# Patient Record
Sex: Male | Born: 1955 | Race: White | Hispanic: No | Marital: Single | Smoking: Former smoker
Health system: Southern US, Community
[De-identification: ages and names within clinical notes are randomized; demographics above are authoritative.]

## PROBLEM LIST (undated history)

## (undated) DIAGNOSIS — J449 Chronic obstructive pulmonary disease, unspecified: Secondary | ICD-10-CM

## (undated) DIAGNOSIS — I10 Essential (primary) hypertension: Secondary | ICD-10-CM

## (undated) DIAGNOSIS — E785 Hyperlipidemia, unspecified: Secondary | ICD-10-CM

## (undated) DIAGNOSIS — J439 Emphysema, unspecified: Secondary | ICD-10-CM

## (undated) DIAGNOSIS — I700 Atherosclerosis of aorta: Secondary | ICD-10-CM

## (undated) HISTORY — DX: Chronic obstructive pulmonary disease, unspecified: J44.9

## (undated) HISTORY — DX: Emphysema, unspecified: J43.9

## (undated) HISTORY — DX: Hyperlipidemia, unspecified: E78.5

## (undated) HISTORY — PX: WISDOM TOOTH EXTRACTION: SHX21

## (undated) HISTORY — PX: CARDIAC CATHETERIZATION: SHX172

## (undated) HISTORY — DX: Atherosclerosis of aorta: I70.0

## (undated) HISTORY — DX: Essential (primary) hypertension: I10

## (undated) HISTORY — PX: TONSILLECTOMY: SHX5217

---

## 2003-03-22 ENCOUNTER — Encounter: Admission: RE | Admit: 2003-03-22 | Discharge: 2003-04-12 | Payer: Self-pay | Admitting: Sports Medicine

## 2009-02-08 ENCOUNTER — Ambulatory Visit: Payer: Self-pay | Admitting: Internal Medicine

## 2009-02-08 DIAGNOSIS — I10 Essential (primary) hypertension: Secondary | ICD-10-CM | POA: Insufficient documentation

## 2009-02-08 DIAGNOSIS — J449 Chronic obstructive pulmonary disease, unspecified: Secondary | ICD-10-CM | POA: Insufficient documentation

## 2009-02-08 DIAGNOSIS — Z860100 Personal history of colon polyps, unspecified: Secondary | ICD-10-CM | POA: Insufficient documentation

## 2009-02-08 DIAGNOSIS — M810 Age-related osteoporosis without current pathological fracture: Secondary | ICD-10-CM | POA: Insufficient documentation

## 2009-04-05 ENCOUNTER — Ambulatory Visit: Payer: Self-pay | Admitting: Internal Medicine

## 2009-04-05 DIAGNOSIS — E785 Hyperlipidemia, unspecified: Secondary | ICD-10-CM | POA: Insufficient documentation

## 2009-04-05 LAB — CONVERTED CEMR LAB
ALT: 25 U/L
AST: 19 U/L
Albumin: 4.5 g/dL
Alkaline Phosphatase: 95 U/L
BUN: 19 mg/dL
Basophils Absolute: 0.1 K/uL
Basophils Relative: 1 %
Bilirubin, Direct: 0.1 mg/dL
CO2: 26 meq/L
Calcium: 9.8 mg/dL
Chloride: 102 meq/L
Cholesterol: 198 mg/dL
Creatinine, Ser: 1.03 mg/dL
Eosinophils Absolute: 0.4 K/uL
Eosinophils Relative: 5 %
Glucose, Bld: 89 mg/dL
HCT: 41.8 %
HDL: 37 mg/dL — ABNORMAL LOW
Hemoglobin: 13.9 g/dL
Indirect Bilirubin: 0.4 mg/dL
LDL Cholesterol: 127 mg/dL — ABNORMAL HIGH
Lymphocytes Relative: 27 %
Lymphs Abs: 2.4 K/uL
MCHC: 33.3 g/dL
MCV: 89.9 fL
Monocytes Absolute: 0.8 K/uL
Monocytes Relative: 9 %
Neutro Abs: 5.2 K/uL
Neutrophils Relative %: 59 %
Platelets: 244 K/uL
Potassium: 4.5 meq/L
RBC: 4.65 M/uL
RDW: 13.2 %
Sodium: 138 meq/L
TSH: 0.681 u[IU]/mL
Testosterone: 225.31 ng/dL — ABNORMAL LOW
Total Bilirubin: 0.5 mg/dL
Total CHOL/HDL Ratio: 5.4
Total Protein: 6.9 g/dL
Triglycerides: 169 mg/dL — ABNORMAL HIGH
VLDL: 34 mg/dL
Vit D, 1,25-Dihydroxy: 34
WBC: 8.9 10*3/microliter

## 2009-04-08 ENCOUNTER — Telehealth (INDEPENDENT_AMBULATORY_CARE_PROVIDER_SITE_OTHER): Payer: Self-pay | Admitting: *Deleted

## 2009-04-12 ENCOUNTER — Ambulatory Visit: Payer: Self-pay | Admitting: Internal Medicine

## 2009-07-01 ENCOUNTER — Telehealth: Payer: Self-pay | Admitting: Internal Medicine

## 2009-07-01 DIAGNOSIS — N529 Male erectile dysfunction, unspecified: Secondary | ICD-10-CM | POA: Insufficient documentation

## 2009-07-12 ENCOUNTER — Encounter: Payer: Self-pay | Admitting: Internal Medicine

## 2009-07-12 LAB — CONVERTED CEMR LAB
BUN: 16 mg/dL (ref 6–23)
CO2: 25 meq/L (ref 19–32)
Chloride: 104 meq/L (ref 96–112)
Glucose, Bld: 76 mg/dL (ref 70–99)
Potassium: 4.6 meq/L (ref 3.5–5.3)

## 2009-07-15 ENCOUNTER — Encounter: Payer: Self-pay | Admitting: Internal Medicine

## 2009-07-19 ENCOUNTER — Ambulatory Visit: Payer: Self-pay | Admitting: Internal Medicine

## 2009-07-19 DIAGNOSIS — E291 Testicular hypofunction: Secondary | ICD-10-CM | POA: Insufficient documentation

## 2009-10-11 ENCOUNTER — Ambulatory Visit: Payer: Self-pay | Admitting: Internal Medicine

## 2009-11-25 ENCOUNTER — Telehealth: Payer: Self-pay | Admitting: Internal Medicine

## 2010-02-27 NOTE — Assessment & Plan Note (Signed)
Summary: 2 mo. f/u - jr   Vital Signs:  Patient profile:   55 year old male Weight:      204 pounds BMI:     30.23 O2 Sat:      96 % on Room air Temp:     97.9 degrees F oral Pulse rate:   78 / minute Pulse rhythm:   regular Resp:     16 per minute BP sitting:   90 / 60  (right arm) Cuff size:   large  Vitals Entered By: Glendell Docker CMA (April 12, 2009 2:58 PM)  O2 Flow:  Room air CC: Rm 3- 2 Month Follow up    Primary Care Provider:  Dondra Spry DO  CC:  Rm 3- 2 Month Follow up .  History of Present Illness:  Hypertension Follow-Up      This is a 55 year old man who presents for Hypertension follow-up.  The patient denies lightheadedness and urinary frequency.  The patient denies the following associated symptoms: chest pain.  Compliance with medications (by patient report) has been near 100%.    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   Lungs:  normal respiratory effort, normal breath sounds, and no wheezes.   Heart:  normal rate, regular rhythm, and no gallop.   Extremities:  No lower extremity edema    Impression & Recommendations:  Problem # 1:  HYPERTENSION (ICD-401.9) Lower BP med dose to  10/12.5 mg.  monitor bp at home.  His updated medication list for this problem includes:    Lisinopril-hydrochlorothiazide 10-12.5 Mg Tabs (Lisinopril-hydrochlorothiazide) ..... One by mouth once daily  BP today: 90/60 Prior BP: 104/78 (02/08/2009)  Labs Reviewed: K+: 4.5 (04/05/2009) Creat: : 1.03 (04/05/2009)   Chol: 198 (04/05/2009)   HDL: 37 (04/05/2009)   LDL: 127 (04/05/2009)   TG: 169 (04/05/2009)  Problem # 2:  COPD (ICD-496) No wheezing.   mild DOE.  DC advair.  The following medications were removed from the medication list:    Advair Diskus 250-50 Mcg/dose Aepb (Fluticasone-salmeterol) .Marland Kitchen... 1 puff by mouth two times a day as needed  Complete Medication List: 1)  Lisinopril-hydrochlorothiazide 10-12.5 Mg Tabs (Lisinopril-hydrochlorothiazide) .... One by mouth once daily 2)  Alendronate Sodium 70 Mg Tabs (Alendronate sodium) .... One tablet by mouth once weekly  Patient Instructions: 1)  Please schedule a follow-up appointment in 6 months. Prescriptions: LISINOPRIL-HYDROCHLOROTHIAZIDE 10-12.5 MG TABS (LISINOPRIL-HYDROCHLOROTHIAZIDE) one by mouth once daily  #30 x 5   Entered and Authorized by:   D. Thomos Lemons DO   Signed by:   D. Thomos Lemons DO on 04/12/2009   Method used:   Electronically to        [facility] # [phone]* (retail)  [address]       [place], [place]  [address]       Ph: [phone]       Fax: [phone]   RxID:   [id number]   Current Allergies (reviewed today): No known allergies

## 2010-02-27 NOTE — Assessment & Plan Note (Signed)
Summary: new to est rsc from bump/mhf   Vital Signs:  Patient profile:   55 year old male Height:      69 inches Weight:      213.25 pounds BMI:     31.61 O2 Sat:      97 % on Room air Temp:     97.7 degrees F oral Pulse rate:   92 / minute Pulse rhythm:   regular Resp:     22 per minute BP sitting:   104 / 78  (right arm) Cuff size:   large  Vitals Entered By: Glendell Docker CMA (February 08, 2009 1:14 PM)  O2 Flow:  Room air  Primary Care Provider:  D. Thomos Lemons DO  CC:  New Patient .  History of Present Illness: New Patient  55 y/o with hx of htn, COPD, and osteoporosis to establish  He was diagnosed with htn 5 yrs ago.  BP is well controlled on lisinopril /HCTZ.  Patient reports good medication compliance. He denies chronic cough. He denies dizziness. No history of stroke or coronary artery disease  Patient previously diagnosed with osteoporosis. Unclear if secondary workup performed. He reports low libido and erectile dysfunction greater than 5 years. He takes generic Fosamax 70 mg once weekly. He is also taking vitamin D supplement. He denies esophageal or gastric irritation. No history of dysphagia.  Patient was diagnosed with COPD in 2009. He has history of tobacco use.  60 pack year history. He denies chronic cough. Some dyspnea and exertion. Dyspnea prompted stress test in 2009-reported normal. He has a prescription for Advair but rarely uses. He tries to walk 1 mile a day. He has intermittent dyspnea.  He reports sugars history of heavy alcohol use but quit drinking greater than 10 years ago. No report of chronic liver disease.  Preventive Screening-Counseling & Management  Alcohol-Tobacco     Alcohol drinks/day: 0     Smoking Status: quit     Packs/Day: 1.5     Year Started: 1972     Year Quit: 2004  Caffeine-Diet-Exercise     Caffeine use/day: 1 beverage daily     Does Patient Exercise: yes     Type of exercise: walking     Times/week: 3  Allergies  (verified): No Known Drug Allergies  Past History:  Past Medical History: Hypertension COPD Cardiac stress test negative - 2009 (performed due to dyspnea) Osteoporosis Colonic polyps, hx of (last colonoscopy June/2008-5 polyps removed) [facility]ologist  Past Surgical History: Tonsillectomy  Family History: Family History Hypertension Family History of Prostate CA 1st degree relative <50  Social History: 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  The patient denies chest pain, peripheral edema, prolonged cough, abdominal pain, melena, hematochezia, and severe indigestion/heartburn.         screening colonoscopy June/2008-5 polyps removed. Colonoscopy performed by gastroenterologist in [facility].  no symptoms of claudication,  history of chronic back pain, hip pain or knee pain.  Physical Exam  General:  alert and overweight-appearing.   Head:  normocephalic and atraumatic.   Eyes:  pupils equal, pupils round, and pupils reactive to light.   Ears:  R ear normal and L ear normal.  Mouth:  teeth missing.  upper dental plate Neck:  No deformities, masses, or tenderness noted.no carotid bruits.   Lungs:  normal respiratory effort, normal breath sounds, no crackles, and no wheezes.   Heart:  normal rate, regular rhythm, and no gallop.  heart sounds somewhat distant Abdomen:  protuberant,soft, non-tender, no masses, no hepatomegaly, and no splenomegaly.   Pulses:  diminished pedis dorsalis pulse bilaterally palpable posterior tibial pulse bilaterally Extremities:  No lower extremity edema  Neurologic:  cranial nerves II-XII intact and gait normal.   Psych:  normally interactive  and good eye contact.     Impression & Recommendations:  Problem # 1:  HYPERTENSION (ICD-401.9) BP is well controlled.  BMET and FLP before next OV. obtain copy of medical records.  His updated medication list for this problem includes:    Lisinopril-hydrochlorothiazide 20-25 Mg Tabs (Lisinopril-hydrochlorothiazide) .Marland Kitchen... Take 1 tablet by mouth once a day  BP today: 104/78  Problem # 2:  COPD (ICD-496) Assessment: Unchanged Last PFT 1 yr ago.  some dyspnea with exertion.  obesity maybe contributing factor. We discussed weight loss strategies. Obtain copy of previous PFT.   patient up-to-date with her influenza vaccine. Update with Pneumovax today. His updated medication list for this problem includes:    Advair Diskus 250-50 Mcg/dose Aepb (Fluticasone-salmeterol) .Marland Kitchen... 1 puff by mouth two times a day as needed  Problem # 3:  OSTEOPOROSIS (ICD-733.00) check testosterone level and vit D level.  obtain copy of prev dexa.  no gi complications from fosamax.  His updated medication list for this problem includes:    Alendronate Sodium 70 Mg Tabs (Alendronate sodium) ..... One tablet by mouth once weekly  Complete Medication List: 1)  Lisinopril-hydrochlorothiazide 20-25 Mg Tabs (Lisinopril-hydrochlorothiazide) .... Take 1 tablet by mouth once a day 2)  Advair Diskus 250-50 Mcg/dose Aepb (Fluticasone-salmeterol) .Marland Kitchen.. 1 puff by mouth two times a day as needed 3)  Alendronate Sodium 70 Mg Tabs (Alendronate sodium) .... One tablet by mouth once weekly  Other Orders: Pneumococcal Vaccine (04540) Admin 1st Vaccine (98119)  Patient Instructions: 1)  Please schedule a follow-up appointment in 2 months. 2)  BMP prior to visit, ICD-9:  401.9 3)  Hepatic Panel prior to visit, ICD-9: 272.4 4)  Lipid Panel prior to visit, ICD-9: 272.4 5)  TSH prior to visit, ICD-9: 272.4 6)  CBC w/ Diff prior to visit, ICD-9: 496 7)  Total testosterone: 733.00 8)  Vitamin D level:  733.00 9)  Please return  for lab work one (1) week before your next appointment.  Prescriptions: ALENDRONATE SODIUM 70 MG TABS (ALENDRONATE SODIUM) one tablet by mouth once weekly  #4 x 3   Entered and Authorized by:   D. Thomos Lemons DO   Signed by:   D. Thomos Lemons DO on 02/08/2009   Method used:   Electronically to        [facility] # [phone]* (retail)       [address]       [place], [place]  [address]       Ph: [phone]       Fax: [phone]   RxID:   [id number] LISINOPRIL-HYDROCHLOROTHIAZIDE 20-25 MG TABS (LISINOPRIL-HYDROCHLOROTHIAZIDE) Take 1 tablet by mouth once a day  #30 x 3   Entered and Authorized by:   D. Thomos Lemons DO   Signed by:   D. Thomos Lemons DO on 02/08/2009   Method used:  Electronically to        [facility] # [phone]* (retail)       [address]       [place], [place]  [address]       Ph: [phone]       Fax: [phone]   RxID:   [id number]   Current Allergies (reviewed today): No known allergies      Immunization History:  Tetanus/Td Immunization History:    Tetanus/Td:  historical (07/04/2008)  Influenza Immunization History:    Influenza:  historical (12/11/2008)  Immunizations Administered:  Pneumonia Vaccine:    Vaccine Type: Pneumovax    Site: left deltoid    Mfr: Merck    Dose: 0.5 ml    Route: IM    Given by: Glendell Docker CMA    Exp. Date: 01/10/2010    Lot #: [id number]    VIS given: 11/01/2007    Preventive Care Screening  Last Tetanus Booster:    Date:  07/04/2008    Results:  Historical   Colonoscopy:    Date:  07/21/2006    Results:  Done

## 2010-02-27 NOTE — Assessment & Plan Note (Signed)
Summary: FOLLOW UP ON LABS PER PHONE MESSAGE FROM DARLENE/[facility]   Vital Signs:  Patient profile:   55 year old male Weight:      205.75 pounds BMI:     30.49 O2 Sat:      95 % on Room air Temp:     97.6 degrees F oral Pulse rate:   73 / minute Pulse rhythm:   regular Resp:     20 per minute BP sitting:   108 / 68  (right arm) Cuff size:   regular  Vitals Entered By: Glendell Docker CMA (July 19, 2009 2:11 PM)  O2 Flow:  Room air CC: Rm 2- Follow up on labs Is Patient Diabetic? No Comments questions about testosterone   Primary Care Provider:  DThomos Lemons DO  CC:  Rm 2- Follow up on labs.  History of Present Illness:  Hypertension Follow-Up      This is a 55 year old man who presents for Hypertension follow-up.  The patient reports lightheadedness and impotence.  The patient denies the following associated symptoms: chest pain.  Compliance with medications (by patient report) has been near 100%.  The patient reports that dietary compliance has been fair.    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   Neck:  No deformities, masses, or tenderness noted.no carotid bruits.   Lungs:  normal respiratory effort, normal breath sounds, and no wheezes.   Heart:  normal rate, regular rhythm, and no gallop.   Extremities:  No lower extremity edema  Psych:  normally interactive, good eye contact, not anxious appearing, and not depressed appearing.     Impression & Recommendations:  Problem #  1:  HYPERTENSION (ICD-401.9)  His updated medication list for this problem includes:    Lisinopril-hydrochlorothiazide 10-12.5 Mg Tabs (Lisinopril-hydrochlorothiazide) ..... One half tab  by mouth once daily  BP today: 108/68 Prior BP: 90/60 (04/12/2009)  Labs Reviewed: K+: 4.6 (07/12/2009) Creat: : 0.94 (07/12/2009)   Chol: 198 (04/05/2009)   HDL: 37 (04/05/2009)   LDL: 127 (04/05/2009)   TG: 169 (04/05/2009)  Problem # 2:  ERECTILE DYSFUNCTION, ORGANIC (ICD-607.84) Assessment: Improved  His updated medication list for this problem includes:    Viagra 100 Mg Tabs (Sildenafil citrate) .Marland Kitchen... 1/2 to 1 tab once daily as directed  Problem # 3:  HYPOGONADISM (ICD-257.2) we discussed pros and cons of testosterone replacement I would favor avoiding testosterone replacement due to assoc cv risk  Complete Medication List: 1)  Lisinopril-hydrochlorothiazide 10-12.5 Mg Tabs (Lisinopril-hydrochlorothiazide) .... One half tab  by mouth once daily 2)  Alendronate Sodium 70 Mg Tabs (Alendronate sodium) .... One tablet by mouth once weekly 3)  Viagra 100 Mg Tabs (Sildenafil citrate) .... 1/2 to 1 tab once daily as directed  Patient Instructions: 1)  Please schedule a follow-up appointment in 3 months. 2)  serum free testosterone - 257.2 3)  FSH, LH - 257.2 4)  Please return for lab work one (1) week before your next appointment.  5)  The highlighted prescriptions were electronically sent to your pharmacy Prescriptions: ALENDRONATE  SODIUM 70 MG TABS (ALENDRONATE SODIUM) one tablet by mouth once weekly  #4 x 5   Entered and Authorized by:   D. Thomos Lemons DO   Signed by:   D. Thomos Lemons DO on 07/19/2009   Method used:   Electronically to        [facility] # [phone]* (retail)       [address]       [place], [place]  [address]       Ph: [phone]       Fax: [phone]   RxID:   [id number] LISINOPRIL-HYDROCHLOROTHIAZIDE 10-12.5 MG TABS  (LISINOPRIL-HYDROCHLOROTHIAZIDE) one half tab  by mouth once daily  #30 x 5   Entered and Authorized by:   D. Thomos Lemons DO   Signed by:   D. Thomos Lemons DO on 07/19/2009   Method used:   Electronically to        [facility] # [phone]* (retail)       [address]       [place], [place]  [address]       Ph: [phone]       Fax: [phone]   RxID:   [id number] VIAGRA 100 MG TABS (SILDENAFIL CITRATE) 1/2 to 1 tab once daily as directed  #5 x 0   Entered and Authorized by:   D. Thomos Lemons DO   Signed by:   D. Thomos Lemons DO on 07/19/2009   Method used:   Electronically to        [facility] # [phone]* (retail)       [address]       [place], [place]  [address]       Ph: [phone]       Fax: [phone]   RxID:   [id number]   Current Allergies (reviewed today): No known allergies

## 2010-02-27 NOTE — Letter (Signed)
   [place] at [facility] [address] [place], [place]  [address]  [place] Phone: [phone]      July 15, 2009   [facility] [address] [place], [place] [address]  RE:  LAB RESULTS  Dear  Jacob Frost,  The following is an interpretation of your most recent lab tests.  Please take note of any instructions provided or changes to medications that have resulted from your lab work.  ELECTROLYTES:  Good - no changes needed  KIDNEY FUNCTION TESTS:  Good - no changes needed         Sincerely Yours,    Dr. Thomos Lemons

## 2010-02-27 NOTE — Progress Notes (Signed)
Summary: refill request   Phone Note Refill Request Message from:  Fax from Pharmacy on April 08, 2009 8:41 AM  Refills Requested: Medication #1:  LISINOPRIL-HYDROCHLOROTHIAZIDE 20-25 MG TABS Take 1 tablet by mouth once a day   Dosage confirmed as above?Dosage Confirmed   Brand Name Necessary? No   Supply Requested: 3 months   Last Refilled: 02/08/2009 [facility] [address] [place] [address] fax [phone] phone [phone]   Method Requested: Electronic Next Appointment Scheduled: 04-12-2009 dr Artist Pais  Initial call taken by: Roselle Locus,  April 08, 2009 8:43 AM  Follow-up for Phone Call        Rx refilled. Spoke to [place] at [place] and corrected quantity to #90 x no refills. Follow-up by: Mervin Kung CMA,  April 08, 2009 9:47 AM    Prescriptions: LISINOPRIL-HYDROCHLOROTHIAZIDE 20-25 MG TABS (LISINOPRIL-HYDROCHLOROTHIAZIDE) Take 1 tablet by mouth once a day  #30 x 3   Entered by:   Mervin Kung CMA   Authorized by:   D. Thomos Lemons DO   Signed by:   Mervin Kung CMA on 04/08/2009   Method used:   Electronically to        [facility] # [phone]* (retail)       [address]       [place], [place]  [address]       Ph: [phone]       Fax: [phone]   RxID:   [id number]

## 2010-02-27 NOTE — Progress Notes (Signed)
Summary: Blood  Pressure Readings  Phone Note Call from Patient Call back at Home Phone [phone]   Caller: Patient Call For: D. Thomos Lemons DO Summary of Call: patient called and left voice message  requesting a  return call to report his blood pressure reading since his last office visit Initial call taken by: Glendell Docker CMA,  July 01, 2009 2:51 PM  Follow-up for Phone Call        call was returned to patient he states his blood pressure readings were as followed, and he aslo states that Dr Artist Pais asked him about addressing his testosterone level and at the time he did not want to,however he would like to have that addressed now 88/64 96/62 112/63 118/65 102/65 112/73 97/60 98/69  103/64 Follow-up by: Glendell Docker CMA,  July 01, 2009 2:57 PM  Additional Follow-up for Phone Call Additional follow up Details #1::        please instruct pt to take 1/2 of bp med dose. Instruct pt to come in for BMET in 1 week tired to call pt - no answer at [phone] number I suggest OV within 2 weeks Additional Follow-up by: D. Thomos Lemons DO,  July 01, 2009 5:23 PM  New Problems: ERECTILE DYSFUNCTION, ORGANIC [phone])   Additional Follow-up for Phone Call Additional follow up Details #2::    attempted to contact patient at [phone], no answer, detailed voice message left informing patient per Dr Artist Pais instructions, he was advised to call back to schedule 2 week follow with Dr Artist Pais and return for lab work in one week. Follow-up by: Glendell Docker CMA,  July 02, 2009 8:05 AM  New Problems: ERECTILE DYSFUNCTION, ORGANIC (ICD-607.84) New/Updated Medications: LISINOPRIL-HYDROCHLOROTHIAZIDE 10-12.5 MG TABS (LISINOPRIL-HYDROCHLOROTHIAZIDE) one half tab  by mouth once daily

## 2010-02-27 NOTE — Assessment & Plan Note (Signed)
Summary: 6 month follow up/mhf   Vital Signs:  Patient profile:   55 year old male Height:      69 inches Weight:      205 pounds BMI:     30.38 Temp:     98.0 degrees F oral Pulse rate:   76 / minute Pulse rhythm:   regular Resp:     16 per minute BP sitting:   92 / 76  (left arm) Cuff size:   regular  Vitals Entered By: Lanier Prude, CMA(AAMA) (October 11, 2009 11:18 AM) CC: 3 mo f/u Is Patient Diabetic? No   Primary Care Provider:  Dondra Spry DO  CC:  3 mo f/u.  History of Present Illness:  Hypertension Follow-Up      This is a 55 year old man who presents for Hypertension follow-up.  The patient denies lightheadedness and headaches.  The patient denies the following associated symptoms: chest pain.     Current Diet Breakfast: bacon and egg,  sausage biscuits Lunch: peanut butter sandwich, potatoe chips DInner:  chicken /fried,  TV dinner,  fast food or buffet Beverage:  soft drinks, water  stop drinking - 5 yrs ago.      Preventive Screening-Counseling & Management  Alcohol-Tobacco     Alcohol drinks/day: 0     Smoking Status: quit     Packs/Day: 1.5     Year Started: 1972     Year Quit: 2004  Current Medications (verified): 1)  Lisinopril-Hydrochlorothiazide 10-12.5 Mg Tabs (Lisinopril-Hydrochlorothiazide) .... One Half Tab  By Mouth Once Daily 2)  Alendronate Sodium 70 Mg Tabs (Alendronate Sodium) .... One Tablet By Mouth Once Weekly 3)  Viagra 100 Mg Tabs (Sildenafil Citrate) .... 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   Lungs:  normal respiratory effort, normal breath sounds, and no wheezes.   Heart:  normal rate, regular rhythm, and no gallop.   Extremities:  No lower extremity edema    Impression & Recommendations:  Problem # 1:  HYPERTENSION (ICD-401.9) BP too low.   DC antihypertensive.  monitor BP at home.  The following medications were removed from the medication list:    Lisinopril-hydrochlorothiazide 10-12.5 Mg Tabs (Lisinopril-hydrochlorothiazide) ..... One half tab  by mouth once daily  BP today: 92/76 Prior BP: 108/68 (07/19/2009)  Labs Reviewed: K+: 4.6 (07/12/2009) Creat: : 0.94 (07/12/2009)   Chol: 198 (04/05/2009)   HDL: 37 (04/05/2009)   LDL: 127 (04/05/2009)   TG: 169 (04/05/2009)  Complete Medication List: 1)  Alendronate Sodium 70 Mg Tabs (Alendronate sodium) .... One tablet by mouth once weekly 2)  Viagra 100 Mg Tabs (Sildenafil citrate) .... 1/2 to 1 tab once daily as directed  Patient Instructions: 1)  Please schedule a follow-up appointment in 3 months. 2)  Monitor BP at home.  Call our office if there is significant change

## 2010-02-27 NOTE — Progress Notes (Signed)
Summary: bp readings   Phone Note Call from Patient Call back at Home Phone [phone]   Caller: Patient Call For: yoo  Summary of Call: Has been taking his blood pressure readings.  Dr Artist Pais said to call if they got over 130.  They have been running above that for a few days.   Initial call taken by: Roselle Locus,  November 25, 2009 4:38 PM  Follow-up for Phone Call        restart just lisinopril. see rx FOV in 2 months call office if BP gets worse Follow-up by: D. Thomos Lemons DO,  November 26, 2009 11:56 AM  Additional Follow-up for Phone Call Additional follow up Details #1::        call placed to patient at [phone], he has been advised per Dr Artist Pais instructions. Patient has appointment on file for 01/10/2010 @ 10:45PM Additional Follow-up by: Glendell Docker CMA,  November 26, 2009 2:44 PM    New/Updated Medications: LISINOPRIL 5 MG TABS (LISINOPRIL) one by mouth once daily Prescriptions: LISINOPRIL 5 MG TABS (LISINOPRIL) one by mouth once daily  #30 x 2   Entered and Authorized by:   D. Thomos Lemons DO   Signed by:   D. Thomos Lemons DO on 11/26/2009   Method used:   Electronically to        [facility] # [phone]* (retail)       [address]       [place], [place]  [address]       Ph: [phone]       Fax: [phone]   RxID:   [id number]

## 2010-02-27 NOTE — Miscellaneous (Signed)
Summary: Lab Orders  Clinical Lists Changes  Orders: Added new Test order of T-Basic Metabolic Panel [id number]) - Signed

## 2010-02-28 ENCOUNTER — Encounter: Payer: Self-pay | Admitting: Internal Medicine

## 2010-02-28 ENCOUNTER — Ambulatory Visit: Admit: 2010-02-28 | Payer: Self-pay | Admitting: Internal Medicine

## 2010-02-28 ENCOUNTER — Ambulatory Visit (INDEPENDENT_AMBULATORY_CARE_PROVIDER_SITE_OTHER): Payer: BC Managed Care – PPO | Admitting: Internal Medicine

## 2010-02-28 DIAGNOSIS — I10 Essential (primary) hypertension: Secondary | ICD-10-CM

## 2010-02-28 LAB — CONVERTED CEMR LAB
BUN: 17 mg/dL (ref 6–23)
CRP, High Sensitivity: 5 — ABNORMAL HIGH
Calcium: 10.1 mg/dL (ref 8.4–10.5)
Creatinine, Ser: 0.91 mg/dL (ref 0.40–1.50)
Glucose, Bld: 88 mg/dL (ref 70–99)
Sodium: 135 meq/L (ref 135–145)

## 2010-03-03 ENCOUNTER — Telehealth: Payer: Self-pay | Admitting: Internal Medicine

## 2010-03-06 ENCOUNTER — Telehealth: Payer: Self-pay | Admitting: Internal Medicine

## 2010-03-07 ENCOUNTER — Telehealth: Payer: Self-pay | Admitting: Internal Medicine

## 2010-03-13 NOTE — Progress Notes (Signed)
Summary: lab result, lab order  Phone Note Outgoing Call   Summary of Call: call pt - recent blood test shows elevated CRP (C reactive protein ) - this test may suggest higher cardiovascular risk I suggest pt start cholesterol medication see rx needs FLP, LFTs before next f/u Initial call taken by: D. Thomos Lemons DO,  March 03, 2010 8:36 AM  Follow-up for Phone Call        Left message on machine to return my call. Nicki Guadalajara Fergerson CMA Duncan Dull)  March 03, 2010 1:10 PM   Left message on machine to return my call. Nicki Guadalajara Fergerson CMA (AAMA)  March 04, 2010 1:32 PM   Additional Follow-up for Phone Call Additional follow up Details #1::        Left detailed message on pt's cell # re: Dr Olegario Messier instruction and to call if any questions as pt has not returned my previous calls. Instructed pt to return to the lab 1 week before f/u in August. Order has been entered and faxed to the lab. Additional Follow-up by: Mervin Kung CMA Duncan Dull),  March 05, 2010 2:31 PM    New/Updated Medications: ATORVASTATIN CALCIUM 10 MG TABS (ATORVASTATIN CALCIUM) one by mouth once daily Prescriptions: ATORVASTATIN CALCIUM 10 MG TABS (ATORVASTATIN CALCIUM) one by mouth once daily  #30 x 3   Entered and Authorized by:   D. Thomos Lemons DO   Signed by:   D. Thomos Lemons DO on 03/03/2010   Method used:   Electronically to        [facility] # [phone]* (retail)       [address]       [place], [place]  [address]       Ph: [phone]       Fax: [phone]   RxID:   [id number]

## 2010-03-13 NOTE — Progress Notes (Signed)
Summary: lab results mailed  Phone Note Call from Patient   Caller: Patient Call For: nurse Summary of Call: pt would like lab results mailed to home address. phone# [phone]. please assist. Initial call taken by: Elba Barman,  March 06, 2010 9:49 AM  Follow-up for Phone Call        copy of lab results mailed to patients address on file Follow-up by: Glendell Docker CMA,  March 06, 2010 10:11 AM

## 2010-03-13 NOTE — Progress Notes (Signed)
Summary: Cost of  medication  Phone Note Call from Patient   Caller: Patient Call For: nurse Reason for Call: Talk to Nurse Summary of Call: pt called and said that the cholesterol med that dr.yoo called in was over $100. Pt wants to know if there is any other med that is less expensive. phone# [phone]. please assist. Initial call taken by: Elba Barman,  March 07, 2010 12:06 PM  Follow-up for Phone Call        change to simvastatin  see rx Follow-up by: D. Thomos Lemons DO,  March 07, 2010 4:58 PM  Additional Follow-up for Phone Call Additional follow up Details #1::        call placed to patient at [phone], he has been informed of medication change Additional Follow-up by: Glendell Docker CMA,  March 07, 2010 5:05 PM    New/Updated Medications: SIMVASTATIN 20 MG TABS (SIMVASTATIN) one by mouth qpm Prescriptions: SIMVASTATIN 20 MG TABS (SIMVASTATIN) one by mouth qpm  #39 x 3   Entered and Authorized by:   D. Thomos Lemons DO   Signed by:   D. Thomos Lemons DO on 03/07/2010   Method used:   Electronically to        [facility] # [phone]* (retail)       [address]       [place], [place]  [address]       Ph: [phone]       Fax: [phone]   RxID:   [id number]

## 2010-03-19 NOTE — Assessment & Plan Note (Signed)
Summary: 3 month fu/mhf rsc from bump/mhf   Vital Signs:  Patient profile:   55 year old male Height:      69 inches Weight:      197 pounds BMI:     29.20 O2 Sat:      97 % on Room air Temp:     97.4 degrees F oral Pulse rate:   67 / minute Resp:     18 per minute BP sitting:   102 / 70  (right arm) Cuff size:   large  Vitals Entered By: Glendell Docker CMA (February 28, 2010 2:19 PM)  O2 Flow:  Room air CC: 3 Month follow up  Is Patient Diabetic? No Pain Assessment Patient in pain? no      Comments no concerns    Primary Care Provider:  Dondra Spry DO  CC:  3 Month follow up .  History of Present Illness: 55 y/o male for follow up re:  htn  bp medication changed to just lisinopril.  hctz stopped sbp 110/70 feels less dizzy bp rose to 130's -140's when he stopped all meds     Preventive Screening-Counseling & Management  Alcohol-Tobacco     Smoking Status: quit  Allergies (verified): No Known Drug Allergies  Past History:  Past Medical History: Hypertension  COPD   Cardiac stress test negative - 2009 (performed due to dyspnea) Osteoporosis   Colonic polyps, hx of (last colonoscopy June/2008-5 polyps removed) [facility] gastro[facility]ologist  Family History: Family History Hypertension Family History of Prostate CA 1st degree relative <50  girlfriend died of lung cancer      Social History: 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   Neck:  No deformities, masses, or tenderness noted.no carotid bruits.   Lungs:  normal respiratory effort, normal breath sounds, and no wheezes.   Heart:  normal rate, regular rhythm, and no gallop.     Impression & Recommendations:  Problem # 1:  HYPERTENSION (ICD-401.9)    he stopped meds while vacationing [place] the  beach and noticed SBP 130's.   we restarted just ACE but bp still somewhat low. change to benazeril  The following medications were removed from the medication list:    Lisinopril 5 Mg Tabs (Lisinopril) ..... One by mouth once daily His updated medication list for this problem includes:    Benazepril Hcl 5 Mg Tabs (Benazepril hcl) ..... One by mouth once daily  BP today: 102/70 Prior BP: 92/76 (10/11/2009)  Labs Reviewed: K+: 4.6 (07/12/2009) Creat: : 0.94 (07/12/2009)   Chol: 198 (04/05/2009)   HDL: 37 (04/05/2009)   LDL: 127 (04/05/2009)   TG: 169 (04/05/2009)  Orders: T-Basic Metabolic Panel [id number])  Complete Medication List: 1)  Alendronate Sodium 70 Mg Tabs (Alendronate sodium) .... One tablet by mouth once weekly 2)  Viagra 100 Mg Tabs (Sildenafil citrate) .... 1/2 to 1 tab once daily as directed 3)  Benazepril Hcl 5 Mg Tabs (Benazepril hcl) .... One by mouth once daily  Other Orders: CRP, high sensitivity-[facility] ([id number])  Patient Instructions: 1)  Please schedule a follow-up appointment in 6 months CPX. 2)  BMP prior to visit, ICD-9:  401.9 3)  Lipid Panel prior to visit, ICD-9: 401.9 4)  TSH prior to visit, ICD-9: 401.9 5)  PSA prior to visit, ICD-9: v76.44  6)  Please return for lab work one (1) week before your next appointment.  Prescriptions: ALENDRONATE SODIUM 70 MG TABS (ALENDRONATE SODIUM) one tablet by mouth once weekly  #4 x 11   Entered and Authorized by:   D. Thomos Lemons DO   Signed by:   D. Thomos Lemons DO on 02/28/2010   Method used:   Electronically to        [facility] # [phone]* (retail)       [address]       [place], [place]  [address]       Ph: [phone]       Fax: [phone]   RxID:   [id number] BENAZEPRIL HCL 5 MG TABS (BENAZEPRIL HCL) one by mouth once daily  #90 x 1   Entered and Authorized by:   D. Thomos Lemons DO   Signed by:   D. Thomos Lemons DO on 02/28/2010   Method used:   Electronically to         [facility] # [phone]* (retail)       [address]       [place], [place]  [address]       Ph: [phone]       Fax: [phone]   RxID:   [id number]    Orders Added: 1)  T-Basic Metabolic Panel [id number] 2)  CRP, high sensitivity-[facility] [id number] 3)  Est. Patient Level III [[address]]   Immunization History:  Influenza Immunization History:    Influenza:  historical (12/10/2009)   Immunization History:  Influenza Immunization History:    Influenza:  Historical (12/10/2009)    Current Allergies (reviewed today): No known allergies

## 2010-05-09 ENCOUNTER — Other Ambulatory Visit: Payer: Self-pay | Admitting: *Deleted

## 2010-05-09 DIAGNOSIS — N529 Male erectile dysfunction, unspecified: Secondary | ICD-10-CM

## 2010-05-09 NOTE — Telephone Encounter (Signed)
Patient called and left voice message wanting to know if Dr Artist Pais would provide a written Rx for Viagra for him to mail away to [place] to have filled

## 2010-05-12 NOTE — Telephone Encounter (Signed)
Ok for refill rx x 5

## 2010-05-13 MED ORDER — SILDENAFIL CITRATE 100 MG PO TABS: 100.0000 mg | ORAL_TABLET | Freq: Every day | ORAL | Status: DC | PRN

## 2010-05-13 NOTE — Telephone Encounter (Signed)
Call placed to patient at [phone], no answer. A detailed voice message was left informing patient Rx will be mailed to him at his home address. He was advised to call back if any questions

## 2010-05-19 ENCOUNTER — Telehealth: Payer: Self-pay | Admitting: Internal Medicine

## 2010-05-19 NOTE — Telephone Encounter (Signed)
Lisinopril HCTZ was removed from pt's medication list on 09/2009. Which medication is pt needing refilled? Spoke to pt and he thought he was taking Lisinopril. I advised him it had been removed from his medication list in 02/2010 and Benazepril was sent to the pharmacy. Pt states he never picked up the prescription. I advised pt to call the pharmacy and tell them he needs to fill that Rx. Pt voices understanding.

## 2010-05-19 NOTE — Telephone Encounter (Signed)
Refill- lisino-hctz 20-25mg  tab. Take one tablet by mouth every day. Qty 30. Last fill 3.14.11

## 2010-08-29 ENCOUNTER — Encounter: Payer: BC Managed Care – PPO | Admitting: Internal Medicine

## 2017-07-16 ENCOUNTER — Encounter: Payer: Self-pay | Admitting: Family Medicine

## 2017-07-16 ENCOUNTER — Ambulatory Visit (INDEPENDENT_AMBULATORY_CARE_PROVIDER_SITE_OTHER): Payer: BLUE CROSS/BLUE SHIELD | Admitting: Family Medicine

## 2017-07-16 VITALS — BP 108/72 | HR 86 | Temp 97.7°F | Ht 69.0 in | Wt 221.1 lb

## 2017-07-16 DIAGNOSIS — E785 Hyperlipidemia, unspecified: Secondary | ICD-10-CM | POA: Diagnosis not present

## 2017-07-16 DIAGNOSIS — I10 Essential (primary) hypertension: Secondary | ICD-10-CM

## 2017-07-16 DIAGNOSIS — J449 Chronic obstructive pulmonary disease, unspecified: Secondary | ICD-10-CM

## 2017-07-16 MED ORDER — ATORVASTATIN CALCIUM 10 MG PO TABS: 10.0000 mg | ORAL_TABLET | Freq: Every day | ORAL | 2 refills | Status: DC

## 2017-07-16 MED ORDER — LISINOPRIL-HYDROCHLOROTHIAZIDE 20-25 MG PO TABS: 1.0000 | ORAL_TABLET | Freq: Every day | ORAL | 2 refills | Status: DC

## 2017-07-16 MED ORDER — SILDENAFIL CITRATE 100 MG PO TABS: 100.0000 mg | ORAL_TABLET | Freq: Every day | ORAL | 2 refills | Status: DC | PRN

## 2017-07-16 MED ORDER — ZOLPIDEM TARTRATE 10 MG PO TABS: 10.0000 mg | ORAL_TABLET | Freq: Every evening | ORAL | 1 refills | Status: DC | PRN

## 2017-07-16 NOTE — Progress Notes (Signed)
Pre visit review using our clinic review tool, if applicable. No additional management support is needed unless otherwise documented below in the visit note. 

## 2017-07-16 NOTE — Patient Instructions (Addendum)
Sleep is important to [place] all. Getting good sleep is imperative to adequate functioning during the day. Work with our counselors who are trained to help people obtain quality sleep. Call [phone] to schedule an appointment or if you are curious about insurance coverage/cost.  Sleep Hygiene Tips:  Do not watch TV or look at screens within 1 hour of going to bed. If you do, make sure there is a blue light filter (nighttime mode) involved.  Try to go to bed around the same time every night. Wake up at the same time within 1 hour of regular time. Ex: If you wake up at 7 AM for work, do not sleep past 8 AM on days that you don't work.  Do not drink alcohol before bedtime.  Do not consume caffeine-containing beverages after noon or within 9 hours of intended bedtime.  Get regular exercise/physical activity in your life, but not within 2 hours of planned bedtime.  Do not take naps.   Do not eat within 2 hours of planned bedtime.  Melatonin, 3-5 mg 30-60 minutes before planned bedtime may be helpful.   The bed should be for sleep or sex only. If after 20-30 minutes you are unable to fall asleep, get up and do something relaxing. Do this until you feel ready to go to sleep again.

## 2017-07-16 NOTE — Progress Notes (Signed)
Chief Complaint  Patient presents with  . New Patient (Initial Visit)       New Patient Visit SUBJECTIVE: HPI: Jacob Frost is an 62 y.o.male who is being seen for establishing care.  The patient was previously seen at another office.   Hypertension Patient presents for hypertension follow up. He does monitor home blood pressures.  Blood pressures ranging on average from 110's/70's. He is compliant with medication- Prinzide 20-25 mg/d. Patient has these side effects of medication: none He is sometimes adhering to a healthy diet overall. Exercise: walking  Hyperlipidemia Patient presents for dyslipidemia follow up. Currently being treated with Lipitor 10 mg/d and compliance with treatment thus far has been good. He denies myalgias. He is not adhering to a healthy. The patient exercises intermittently.  The patient is not known to have coexisting coronary artery disease.   No Known Allergies  Past Medical History:  Diagnosis Date  . COPD (chronic obstructive pulmonary disease) ([place])   . Hyperlipidemia   . Hypertension    History reviewed. No pertinent surgical history. Family History  Problem Relation Age of Onset  . Diabetes Father   . Diabetes Sister   . Hypertension Sister    No Known Allergies  Current Outpatient Medications:  .  albuterol (PROVENTIL HFA;VENTOLIN HFA) 108 (90 Base) MCG/ACT inhaler, Inhale into the lungs every 6 (six) hours as needed for wheezing or shortness of breath., Disp: , Rfl:  .  atorvastatin (LIPITOR) 10 MG tablet, Take 10 mg by mouth daily., Disp: , Rfl:  .  lisinopril-hydrochlorothiazide (PRINZIDE,ZESTORETIC) 20-25 MG tablet, Take 1 tablet by mouth daily., Disp: , Rfl:  .  sildenafil (VIAGRA) 100 MG tablet, Take 100 mg by mouth daily as needed for erectile dysfunction., Disp: , Rfl:  .  tiotropium (SPIRIVA) 18 MCG inhalation capsule, Place 18 mcg into inhaler and inhale daily., Disp: , Rfl:  .  zolpidem (AMBIEN) 10 MG tablet, Take 1  tablet (10 mg total) by mouth at bedtime as needed for sleep., Disp: 30 tablet, Rfl: 1  ROS Cardiovascular: Denies chest pain  Respiratory: Denies dyspnea   OBJECTIVE: BP 108/72 (BP Location: Left Arm, Patient Position: Sitting, Cuff Size: Normal)   Pulse 86   Temp 97.7 F (36.5 C) (Oral)   Ht 5\' 9"  (1.753 m)   Wt 221 lb 2 oz (100.3 kg)   SpO2 98%   BMI 32.65 kg/m   Constitutional: -  VS reviewed -  Well developed, well nourished, appears stated age -  No apparent distress  Psychiatric: -  Oriented to person, place, and time -  Memory intact -  Affect and mood normal -  Fluent conversation, good eye contact -  Judgment and insight age appropriate  Eye: -  Conjunctivae clear, no discharge -  Pupils symmetric, round, reactive to light  ENMT: -  MMM    Pharynx moist, no exudate, no erythema  Neck: -  No gross swelling, no palpable masses -  Thyroid midline, not enlarged, mobile, no palpable masses  Cardiovascular: -  RRR -  No bruits -  No LE edema  Respiratory: -  Normal respiratory effort, no accessory muscle use, no retraction -  Breath sounds equal, no wheezes, no ronchi, no crackles  Skin: -  No significant lesion on inspection -  Warm and dry to palpation   ASSESSMENT/PLAN: Essential hypertension  Hyperlipidemia, unspecified hyperlipidemia type  Chronic obstructive pulmonary disease, unspecified COPD type ([place])  Patient instructed to sign release of records form from  her previous PCP. Cont current meds.  Counseled on diet and exercise. Sleep info given. Pt uses Ambien around 1x/mo. OK.  Patient should return in 3 mo for CPE. The patient voiced understanding and agreement to the plan.   [place], DO 07/16/17  4:29 PM

## 2017-08-30 ENCOUNTER — Telehealth: Payer: Self-pay

## 2017-08-30 DIAGNOSIS — L299 Pruritus, unspecified: Secondary | ICD-10-CM

## 2017-08-30 MED ORDER — CLOBETASOL PROPIONATE 0.05 % EX FOAM: Freq: Two times a day (BID) | CUTANEOUS | 0 refills | Status: AC

## 2017-08-30 NOTE — Telephone Encounter (Signed)
Author received fax from [facility] requesting new rx for clobetasaol propionate 0.05% foam 100gm. No history in Epic regarding medication use. Author phone pt. to confirm. Pt. states he has itchy, flakey, red scalp, with hair falling out, which is not new. Pt. states he has had the med in the past, about two years ago. Pt has CPE scheduled for 9/9 with Dr. Nani Ravens. Routed to Dr. Nani Ravens to review.

## 2017-08-30 NOTE — Telephone Encounter (Signed)
OK TY

## 2017-08-30 NOTE — Addendum Note (Signed)
Addended by: Raynelle Dick R on: 08/30/2017 05:00 PM   Modules accepted: Orders

## 2017-08-30 NOTE — Telephone Encounter (Signed)
That's fine in meantime. OK to send in. TY.

## 2017-08-31 NOTE — Telephone Encounter (Signed)
Medication not covered- trying to attempt PA however I need a dx for this medication.

## 2017-09-01 NOTE — Telephone Encounter (Signed)
Spoke to the patient, he said they went ahead and filled through [facility]. ([facility] Did [facility] for [facility]) Was seen in 2017 by a Dermatologist who prescribed it for him He will discuss at his September appt with PCP for this.

## 2017-09-01 NOTE — Telephone Encounter (Signed)
Please find out from patient.  It may be easier to come in for an exam. TY.

## 2017-09-06 ENCOUNTER — Other Ambulatory Visit: Payer: Self-pay | Admitting: Family Medicine

## 2017-09-06 ENCOUNTER — Telehealth: Payer: Self-pay | Admitting: Family Medicine

## 2017-09-06 MED ORDER — ATORVASTATIN CALCIUM 10 MG PO TABS: 10.0000 mg | ORAL_TABLET | Freq: Every day | ORAL | 0 refills | Status: DC

## 2017-09-06 NOTE — Telephone Encounter (Signed)
Filled today at [facility]

## 2017-09-06 NOTE — Telephone Encounter (Signed)
Copied from [place] [id number]. Topic: Quick Communication - See Telephone Encounter >> Sep 06, 2017 12:11 PM Mylinda Latina, NT wrote: CRM for notification. See Telephone encounter for: 09/06/17. Patient called and stated that he needs a refill of his atorvastatin (LIPITOR) 10 MG tablet 90 day supply  [facility] [place], [place] [phone] (Phone) [phone] (Fax)

## 2017-09-09 ENCOUNTER — Telehealth: Payer: Self-pay | Admitting: Family Medicine

## 2017-09-09 MED ORDER — SILDENAFIL CITRATE 100 MG PO TABS: 100.0000 mg | ORAL_TABLET | Freq: Every day | ORAL | 2 refills | Status: DC | PRN

## 2017-09-09 NOTE — Telephone Encounter (Signed)
Copied from [place] [id number]. Topic: Quick Communication - Rx Refill/Question >> Sep 09, 2017  9:18 AM Marin Olp L wrote: Medication: sildenafil (VIAGRA) 100 MG tablet   Has the patient contacted their pharmacy? Yes.   (Agent: If no, request that the patient contact the pharmacy for the refill.) (Agent: If yes, when and what did the pharmacy advise?)  Preferred Pharmacy (with phone number or street name): [facility] [phone] Phone: [phone] Fax: [phone]   Agent: Please be advised that RX refills may take up to 3 business days. We ask that you follow-up with your pharmacy.

## 2017-10-04 ENCOUNTER — Encounter: Payer: BLUE CROSS/BLUE SHIELD | Admitting: Family Medicine

## 2017-10-20 ENCOUNTER — Ambulatory Visit (INDEPENDENT_AMBULATORY_CARE_PROVIDER_SITE_OTHER): Payer: BLUE CROSS/BLUE SHIELD | Admitting: Family Medicine

## 2017-10-20 ENCOUNTER — Encounter: Payer: Self-pay | Admitting: Family Medicine

## 2017-10-20 VITALS — BP 134/84 | HR 83 | Temp 97.7°F | Resp 16 | Wt 224.0 lb

## 2017-10-20 DIAGNOSIS — L219 Seborrheic dermatitis, unspecified: Secondary | ICD-10-CM

## 2017-10-20 DIAGNOSIS — Z1159 Encounter for screening for other viral diseases: Secondary | ICD-10-CM | POA: Diagnosis not present

## 2017-10-20 DIAGNOSIS — Z87891 Personal history of nicotine dependence: Secondary | ICD-10-CM | POA: Insufficient documentation

## 2017-10-20 DIAGNOSIS — Z23 Encounter for immunization: Secondary | ICD-10-CM | POA: Diagnosis not present

## 2017-10-20 DIAGNOSIS — Z125 Encounter for screening for malignant neoplasm of prostate: Secondary | ICD-10-CM | POA: Diagnosis not present

## 2017-10-20 DIAGNOSIS — Z114 Encounter for screening for human immunodeficiency virus [HIV]: Secondary | ICD-10-CM | POA: Diagnosis not present

## 2017-10-20 DIAGNOSIS — J449 Chronic obstructive pulmonary disease, unspecified: Secondary | ICD-10-CM | POA: Diagnosis not present

## 2017-10-20 DIAGNOSIS — Z0000 Encounter for general adult medical examination without abnormal findings: Secondary | ICD-10-CM

## 2017-10-20 LAB — COMPREHENSIVE METABOLIC PANEL
ALBUMIN: 4.4 g/dL (ref 3.5–5.2)
ALT: 45 U/L (ref 0–53)
AST: 24 U/L (ref 0–37)
Alkaline Phosphatase: 109 U/L (ref 39–117)
BILIRUBIN TOTAL: 0.6 mg/dL (ref 0.2–1.2)
BUN: 12 mg/dL (ref 6–23)
CALCIUM: 9.9 mg/dL (ref 8.4–10.5)
CO2: 30 mEq/L (ref 19–32)
Chloride: 102 mEq/L (ref 96–112)
Creatinine, Ser: 0.89 mg/dL (ref 0.40–1.50)
GFR: 91.74 mL/min (ref >60.00)
Glucose, Bld: 100 mg/dL — ABNORMAL HIGH (ref 70–99)
Potassium: 4.5 mEq/L (ref 3.5–5.1)
SODIUM: 139 meq/L (ref 135–145)
TOTAL PROTEIN: 6.9 g/dL (ref 6.0–8.3)

## 2017-10-20 LAB — LIPID PANEL
Cholesterol: 151 mg/dL (ref 0–200)
HDL: 30.5 mg/dL — ABNORMAL LOW (ref >39.00)
NonHDL: 120.12
TRIGLYCERIDES: 280 mg/dL — AB (ref 0.0–149.0)
Total CHOL/HDL Ratio: 5
VLDL: 56 mg/dL — ABNORMAL HIGH (ref 0.0–40.0)

## 2017-10-20 LAB — PSA: PSA: 1.61 ng/mL (ref 0.10–4.00)

## 2017-10-20 LAB — LDL CHOLESTEROL, DIRECT: Direct LDL: 89 mg/dL

## 2017-10-20 MED ORDER — KETOCONAZOLE 2 % EX SHAM: 1.0000 "application " | MEDICATED_SHAMPOO | CUTANEOUS | 0 refills | Status: DC

## 2017-10-20 NOTE — Patient Instructions (Addendum)
Give [place] 2-3 business days to get the results of your labs back.   Aim to do some physical exertion for 150 minutes per week. This is typically divided into 5 days per week, 30 minutes per day. The activity should be enough to get your heart rate up. Anything is better than nothing if you have time constraints.  Keep the diet clean.  The new Shingrix vaccine (for shingles) is a 2 shot series. It can make people feel low energy, achy and almost like they have the flu for 48 hours after injection. Please plan accordingly when deciding on when to get this shot. Call our office for a nurse visit appointment to get this. The second shot of the series is less severe regarding the side effects, but it still lasts 48 hours.   Let us know if you need anything.  Healthy Eating Plan Many factors influence your heart health, including eating and exercise habits. Heart (coronary) risk increases with abnormal blood fat (lipid) levels. Heart-healthy meal planning includes limiting unhealthy fats, increasing healthy fats, and making other small dietary changes. This includes maintaining a healthy body weight to help keep lipid levels within a normal range.  WHAT IS MY PLAN?  Your health care provider recommends that you:  Drink a glass of water before meals to help with satiety.  Eat slowly.  An alternative to the water is to add Metamucil. This will help with satiety as well. It does contain calories, unlike water.  WHAT TYPES OF FAT SHOULD I CHOOSE?  Choose healthy fats more often. Choose monounsaturated and polyunsaturated fats, such as olive oil and canola oil, flaxseeds, walnuts, almonds, and seeds.  Eat more omega-3 fats. Good choices include salmon, mackerel, sardines, tuna, flaxseed oil, and ground flaxseeds. Aim to eat fish at least two times each week.  Avoid foods with partially hydrogenated oils in them. These contain trans fats. Examples of foods that contain trans fats are stick margarine,  some tub margarines, cookies, crackers, and other baked goods. If you are going to avoid a fat, this is the one to avoid!  WHAT GENERAL GUIDELINES DO I NEED TO FOLLOW?  Check food labels carefully to identify foods with trans fats. Avoid these types of options when possible.  Fill one half of your plate with vegetables and green salads. Eat 4-5 servings of vegetables per day. A serving of vegetables equals 1 cup of raw leafy vegetables,  cup of raw or cooked cut-up vegetables, or  cup of vegetable juice.  Fill one fourth of your plate with whole grains. Look for the word "whole" as the first word in the ingredient list.  Fill one fourth of your plate with lean protein foods.  Eat 4-5 servings of fruit per day. A serving of fruit equals one medium whole fruit,  cup of dried fruit,  cup of fresh, frozen, or canned fruit. Try to avoid fruits in cups/syrups as the sugar content can be high.  Eat more foods that contain soluble fiber. Examples of foods that contain this type of fiber are apples, broccoli, carrots, beans, peas, and barley. Aim to get 20-30 g of fiber per day.  Eat more home-cooked food and less restaurant, buffet, and fast food.  Limit or avoid alcohol.  Limit foods that are high in starch and sugar.  Avoid fried foods when able.  Cook foods by using methods other than frying. Baking, boiling, grilling, and broiling are all great options. Other fat-reducing suggestions include: ? Removing the  skin from poultry. ? Removing all visible fats from meats. ? Skimming the fat off of stews, soups, and gravies before serving them. ? Steaming vegetables in water or broth.  Lose weight if you are overweight. Losing just 5-10% of your initial body weight can help your overall health and prevent diseases such as diabetes and heart disease.  Increase your consumption of nuts, legumes, and seeds to 4-5 servings per week. One serving of dried beans or legumes equals  cup after being  cooked, one serving of nuts equals 1 ounces, and one serving of seeds equals  ounce or 1 tablespoon.  WHAT ARE GOOD FOODS CAN I EAT? Grains Grainy breads (try to find bread that is 3 g of fiber per slice or greater), oatmeal, light popcorn. Whole-grain cereals. Rice and pasta, including brown rice and those that are made with whole wheat. Edamame pasta is a great alternative to grain pasta. It has a higher protein content. Try to avoid significant consumption of white bread, sugary cereals, or pastries/baked goods.  Vegetables All vegetables. Cooked white potatoes do not count as vegetables.  Fruits All fruits, but limit pineapple and bananas as these fruits have a higher sugar content.  Meats and Other Protein Sources Lean, well-trimmed beef, veal, pork, and lamb. Chicken and [place] without skin. All fish and shellfish. Wild duck, rabbit, pheasant, and venison. Egg whites or low-cholesterol egg substitutes. Dried beans, peas, lentils, and tofu.Seeds and most nuts.  Dairy Low-fat or nonfat cheeses, including ricotta, string, and mozzarella. Skim or 1% milk that is liquid, powdered, or evaporated. Buttermilk that is made with low-fat milk. Nonfat or low-fat yogurt. Soy/Almond milk are good alternatives if you cannot handle dairy.  Beverages Water is the best for you. Sports drinks with less sugar are more desirable unless you are a highly active athlete.  Sweets and Desserts Sherbets and fruit ices. Honey, jam, marmalade, jelly, and syrups. Dark chocolate.  Eat all sweets and desserts in moderation.  Fats and Oils Nonhydrogenated (trans-free) margarines. Vegetable oils, including soybean, sesame, sunflower, olive, peanut, safflower, corn, canola, and cottonseed. Salad dressings or mayonnaise that are made with a vegetable oil. Limit added fats and oils that you use for cooking, baking, salads, and as spreads.  Other Cocoa powder. Coffee and tea. Most condiments.  The items listed  above may not be a complete list of recommended foods or beverages. Contact your dietitian for more options.

## 2017-10-20 NOTE — Progress Notes (Signed)
CC: WC  Well Male Jacob Frost is here for a complete physical.   His last physical was >1 year ago.  Current diet: in general, an "unhealthy" diet.  Current exercise: walking 1.5 mi/d Weight trend: stable Seat belt? Yes.    Health maintenance Shingrix- No Colonoscopy- Has one scheduled 12/2017; 01/22/15- found polyps Tetanus- Yes HIV- No  Hep C- No Lung cancer screening- Yes Prostate cancer screening- Yes   +scaling and bumps on scalp. He thinks he is losing his hair on top as well. His father and grandfather lost their hair in this area also. No new topicals. No pain. Has not tried anything thus far.    Past Medical History:  Diagnosis Date  . COPD (chronic obstructive pulmonary disease) ([place])   . Hyperlipidemia   . Hypertension     History reviewed. No pertinent surgical history.  Medications  Current Outpatient Medications on File Prior to Visit  Medication Sig Dispense Refill  . albuterol (PROVENTIL HFA;VENTOLIN HFA) 108 (90 Base) MCG/ACT inhaler Inhale into the lungs every 6 (six) hours as needed for wheezing or shortness of breath.    Marland Kitchen atorvastatin (LIPITOR) 10 MG tablet Take 1 tablet (10 mg total) by mouth daily. 90 tablet 0  . clobetasol (OLUX) 0.05 % topical foam Apply topically 2 (two) times daily. 100 g 0  . lisinopril-hydrochlorothiazide (PRINZIDE,ZESTORETIC) 20-25 MG tablet Take 1 tablet by mouth daily. 90 tablet 2  . sildenafil (VIAGRA) 100 MG tablet Take 1 tablet (100 mg total) by mouth daily as needed for erectile dysfunction. 10 tablet 2  . tiotropium (SPIRIVA) 18 MCG inhalation capsule Place 18 mcg into inhaler and inhale daily.    Marland Kitchen zolpidem (AMBIEN) 10 MG tablet Take 1 tablet (10 mg total) by mouth at bedtime as needed for sleep. 30 tablet 1   Allergies No Known Allergies  Family History Family History  Problem Relation Age of Onset  . Diabetes Father   . Diabetes Sister   . Hypertension Sister     Review of Systems: Constitutional:  no  fevers Eye:  no recent significant change in vision Ear/Nose/Mouth/Throat:  Ears:  no hearing loss Nose/Mouth/Throat:  no complaints of nasal congestion, no sore throat Cardiovascular:  no chest pain, no palpitations Respiratory:  no current shortness of breath Gastrointestinal:  no abdominal pain, no change in bowel habits GU:  Male: negative for dysuria, frequency, and incontinence and negative for prostate symptoms; +ED Musculoskeletal/Extremities:  no pain, redness, or swelling of the joints Integumentary (Skin/Breast):  As noted in HPI Neurologic:  no headaches Endocrine: No unexpected weight changes Hematologic/Lymphatic:  no abnormal bleeding  Exam BP 134/84   Pulse 83   Temp 97.7 F (36.5 C) (Oral)   Resp 16   Wt 224 lb (101.6 kg)   SpO2 97%   BMI 33.08 kg/m  General:  well developed, well nourished, in no apparent distress Skin: +scaling on scalp, I did not appreciate any erythema; otherwise no significant moles, warts, or growths Head:  no masses, lesions, or tenderness Eyes:  pupils equal and round, sclera anicteric without injection Ears:  canals without lesions, TMs shiny without retraction, no obvious effusion, no erythema Nose:  nares patent, septum midline, mucosa normal Throat/Pharynx:  lips and gingiva without lesion; tongue and uvula midline; non-inflamed pharynx; no exudates or postnasal drainage Neck: neck supple without adenopathy, thyromegaly, or masses Lungs:  clear to auscultation, breath sounds equal bilaterally, no respiratory distress Cardio:  regular rate and rhythm, no LE edema,  no bruits Abdomen:  abdomen soft, nontender; bowel sounds normal; no masses or organomegaly Genital (male): circumcised penis, no lesions or discharge; testes present bilaterally without masses or tenderness Rectal: Deferred Musculoskeletal:  symmetrical muscle groups noted without atrophy or deformity Extremities:  no clubbing, cyanosis, or edema, no deformities, no skin  discoloration Neuro:  gait normal; deep tendon reflexes normal and symmetric Psych: well oriented with normal range of affect and appropriate judgment/insight  Assessment and Plan  Well adult exam - Plan: Comprehensive metabolic panel, Lipid panel  History of smoking 30 or more pack years - Plan: CT CHEST LUNG CANCER SCREENING LOW DOSE WO CONTRAST  Chronic obstructive pulmonary disease, unspecified COPD type ([place]) - Plan: Ambulatory referral to Pulmonology  Screening for HIV (human immunodeficiency virus) - Plan: HIV Antibody (routine testing w rflx)  Encounter for hepatitis C screening test for low risk patient - Plan: Hepatitis C antibody  Screening for prostate cancer - Plan: PSA  Seborrheic dermatitis of scalp - Plan: ketoconazole (NIZORAL) 2 % shampoo   Well 62 y.o. male. Counseled on diet and exercise. Healthy diet handout given. Pt wishes to get set up with pulm team thru [facility] as well as have imaging thru [facility]. Counseled on risks and benefits of prostate cancer screening with PSA. The patient agrees to undergo testing. Immunizations, labs, and further orders as above. Follow up in 6 mo or prn. The patient voiced understanding and agreement to the plan.  [place], DO 10/20/17 8:44 AM

## 2017-10-20 NOTE — Addendum Note (Signed)
Addended by: Terence Lux B on: 10/20/2017 08:50 AM   Modules accepted: Orders

## 2017-10-21 LAB — HEPATITIS C ANTIBODY
HEP C AB: NONREACTIVE
SIGNAL TO CUT-OFF: 0.01 (ref <1.00)

## 2017-10-21 LAB — HIV ANTIBODY (ROUTINE TESTING W REFLEX): HIV: NONREACTIVE

## 2017-10-22 ENCOUNTER — Telehealth: Payer: Self-pay

## 2017-10-22 DIAGNOSIS — E785 Hyperlipidemia, unspecified: Secondary | ICD-10-CM

## 2017-10-22 MED ORDER — ATORVASTATIN CALCIUM 20 MG PO TABS: 20.0000 mg | ORAL_TABLET | Freq: Every day | ORAL | 3 refills | Status: DC

## 2017-10-22 NOTE — Telephone Encounter (Signed)
-----   Message from Shelda Pal, DO sent at 10/21/2017  5:13 PM EDT ----- Noted. MyChart message sent to patient. Increase dose of Lipitor to 20 mg/d.  RE- please call in atorvastatin 20 mg to take daily and update chart please. TY.

## 2017-10-25 ENCOUNTER — Telehealth: Payer: Self-pay | Admitting: *Deleted

## 2017-10-25 NOTE — Telephone Encounter (Signed)
Received Patient Assistance Form for Viagra via [employer], initial request. Completed and faxed to [place] at [phone]; received from provider signed/SLS

## 2017-11-03 NOTE — Telephone Encounter (Signed)
Received letter from [employer] with denial on patient's request for enrollment d/t the product requested is not on the program; forwarded to provider/SLS 10/09

## 2017-11-05 ENCOUNTER — Other Ambulatory Visit: Payer: Self-pay | Admitting: Family Medicine

## 2017-11-05 NOTE — Telephone Encounter (Signed)
Copied from [place] [id number]. Topic: Quick Communication - Rx Refill/Question >> Nov 05, 2017 11:46 AM Rutherford Nail, NT wrote: **Patient has changed pharmacies. States that his previous pharmacy cannot transfer this prescription to the new pharmacy. New prescription is needed. Please advise.**  Medication: zolpidem (AMBIEN) 10 MG tablet   Has the patient contacted their pharmacy? Yes.   (Agent: If no, request that the patient contact the pharmacy for the refill.) (Agent: If yes, when and what did the pharmacy advise?)  Preferred Pharmacy (with phone number or street name): [facility] [place], [place]: Please be advised that RX refills may take up to 3 business days. We ask that you follow-up with your pharmacy.

## 2017-11-05 NOTE — Telephone Encounter (Signed)
Requested medication (s) are due for refill today: Yes  Requested medication (s) are on the active medication list: Yes  Last refill:  07/16/17  Future visit scheduled: Yes  Notes to clinic:  Unable to refill per protocol, narcotic.     Requested Prescriptions  Pending Prescriptions Disp Refills   zolpidem (AMBIEN) 10 MG tablet 30 tablet 1    Sig: Take 1 tablet (10 mg total) by mouth at bedtime as needed for sleep.     Not Delegated - Psychiatry:  Anxiolytics/Hypnotics Failed - 11/05/2017  6:47 PM      Failed - This refill cannot be delegated      Failed - Urine Drug Screen completed in last 360 days.      Passed - Valid encounter within last 6 months    Recent Outpatient Visits          2 weeks ago Well adult exam   [facility] at [place], [place]   3 months ago Essential hypertension   [facility] at [facility], [place], [place]      Future Appointments            In 5 months [place], [place], [place] [facility] [employer] [facility], [place]

## 2017-11-08 MED ORDER — ZOLPIDEM TARTRATE 10 MG PO TABS: 10.0000 mg | ORAL_TABLET | Freq: Every evening | ORAL | 0 refills | Status: DC | PRN

## 2017-11-28 ENCOUNTER — Other Ambulatory Visit: Payer: Self-pay | Admitting: Family Medicine

## 2017-11-29 ENCOUNTER — Other Ambulatory Visit: Payer: Self-pay | Admitting: Family Medicine

## 2017-12-02 ENCOUNTER — Institutional Professional Consult (permissible substitution): Payer: BLUE CROSS/BLUE SHIELD | Admitting: Emergency Medicine

## 2017-12-02 ENCOUNTER — Ambulatory Visit: Payer: BLUE CROSS/BLUE SHIELD | Admitting: Emergency Medicine

## 2017-12-02 ENCOUNTER — Encounter: Payer: Self-pay | Admitting: Emergency Medicine

## 2017-12-02 VITALS — BP 116/74 | HR 78 | Ht 69.0 in | Wt 224.0 lb

## 2017-12-02 DIAGNOSIS — J449 Chronic obstructive pulmonary disease, unspecified: Secondary | ICD-10-CM

## 2017-12-02 DIAGNOSIS — Z87891 Personal history of nicotine dependence: Secondary | ICD-10-CM | POA: Diagnosis not present

## 2017-12-02 DIAGNOSIS — Z23 Encounter for immunization: Secondary | ICD-10-CM | POA: Diagnosis not present

## 2017-12-02 NOTE — Assessment & Plan Note (Signed)
Not currently on maintenance medications.  He has had some cough associated with Spiriva so this may not be a good choice for him.  We will repeat his pulmonary function testing to assess his degree of obstruction.  Based on that we will start him on schedule bronchodilator therapy, probably LABA/LAMA.  Keep albuterol available for him to use as needed.  He has had the Pneumovax, needs another one after age 62.  He is interested in getting the Prevnar-13.  Flu shot is up-to-date.  We will repeat your pulmonary function testing Do not restart your Spiriva at this time. Keep your albuterol available to use 2 puffs up to every 4 hours if needed for shortness of breath, chest tightness, wheezing.  You can use this prior to exertion as well. Flu shot is up-to-date. Pneumovax was done 8 years ago.  You will need it again after age 60. Prevnar-13 to be given today. Follow with Dr Lamonte Sakai next available with full PFT on the same day.

## 2017-12-02 NOTE — Assessment & Plan Note (Signed)
He has had his first screening CT scan in December 2018, was a RADS 1 scan.  He is due for repeat in December of this year.  I will enroll him in our program so that he can be part of our database, follow-up.

## 2017-12-02 NOTE — Progress Notes (Signed)
Subjective:    Patient ID: Jacob Frost, male    DOB: 03-Apr-1955, 62 y.o.   MRN: [id number]  HPI 62 year old former smoker (58 + pack years) with a history of hypertension, hypercholesterolemia.  He carries a diagnosis of COPD that was made at [facility].  He has been given Spiriva but has not taken it, has albuterol which he uses rarely, usually before exercise.  He is referred today to establish care. He has seen Pulmonary at [facility], Dr Greggory Stallion. He has SOB after a city block. He has some intermittent cough, happens after Spiriva especially. Non productive. No CP, does hear some wheeze, random.   LDCT at [facility] 12/2016 > RADS 1 scan, reassuring. He is scheduled for repeat in December.  He has had pneumovax at age 89.  Flu shot   Review of Systems  Constitutional: Negative for fever and unexpected weight change.  HENT: Negative for congestion, dental problem, ear pain, nosebleeds, postnasal drip, rhinorrhea, sinus pressure, sneezing, sore throat and trouble swallowing.   Eyes: Negative for redness and itching.  Respiratory: Positive for cough, chest tightness, shortness of breath and wheezing.   Cardiovascular: Negative for palpitations and leg swelling.  Gastrointestinal: Negative for nausea and vomiting.  Genitourinary: Negative for dysuria.  Musculoskeletal: Negative for joint swelling.  Skin: Negative for rash.  Neurological: Negative for headaches.  Hematological: Does not bruise/bleed easily.  Psychiatric/Behavioral: Negative for dysphoric mood. The patient is not nervous/anxious.     Past Medical History:  Diagnosis Date  . COPD (chronic obstructive pulmonary disease) ([place])   . Hyperlipidemia   . Hypertension      Family History  Problem Relation Age of Onset  . Diabetes Father   . Diabetes Sister   . Hypertension Sister      Social History   Socioeconomic History  . Marital status: Single    Spouse name: Not on file  . Number of children: Not on  file  . Years of education: Not on file  . Highest education level: Not on file  Occupational History  . Not on file  Social Needs  . Financial resource strain: Not on file  . Food insecurity:    Worry: Not on file    Inability: Not on file  . Transportation needs:    Medical: Not on file    Non-medical: Not on file  Tobacco Use  . Smoking status: Former Smoker    Types: Cigarettes    Start date: 07/16/1968    Last attempt to quit: 07/16/2005    Years since quitting: 12.3  . Smokeless tobacco: Never Used  Substance and Sexual Activity  . Alcohol use: Never    Frequency: Never  . Drug use: Never  . Sexual activity: Not on file  Lifestyle  . Physical activity:    Days per week: Not on file    Minutes per session: Not on file  . Stress: Not on file  Relationships  . Social connections:    Talks on phone: Not on file    Gets together: Not on file    Attends religious service: Not on file    Active member of club or organization: Not on file    Attends meetings of clubs or organizations: Not on file    Relationship status: Not on file  . Intimate partner violence:    Fear of current or ex partner: Not on file    Emotionally abused: Not on file    Physically abused: Not on file  Forced sexual activity: Not on file  Other Topics Concern  . Not on file  Social History Narrative  . Not on file    Has worked in Psychologist, educational, has worked for city of [employer], grounds crew.  Has worked in [employer] [place], also [employer], has lived in [place] as well.    No Known Allergies   Outpatient Medications Prior to Visit  Medication Sig Dispense Refill  . albuterol (PROVENTIL HFA;VENTOLIN HFA) 108 (90 Base) MCG/ACT inhaler Inhale into the lungs every 6 (six) hours as needed for wheezing or shortness of breath.    Marland Kitchen atorvastatin (LIPITOR) 20 MG tablet Take 1 tablet (20 mg total) by mouth daily. 90 tablet 3  . clobetasol (OLUX) 0.05 % topical foam Apply topically 2 (two) times  daily. 100 g 0  . ketoconazole (NIZORAL) 2 % shampoo Apply 1 application topically 2 (two) times a week. 120 mL 0  . lisinopril-hydrochlorothiazide (PRINZIDE,ZESTORETIC) 20-25 MG tablet Take 1 tablet by mouth daily 90 tablet 0  . sildenafil (VIAGRA) 100 MG tablet TAKE 1 TABLET BY MOUTH ONCE DAILY AS NEEDED 30 tablet 0  . tiotropium (SPIRIVA) 18 MCG inhalation capsule Place 18 mcg into inhaler and inhale daily.    Marland Kitchen zolpidem (AMBIEN) 10 MG tablet Take 1 tablet (10 mg total) by mouth at bedtime as needed for sleep. 30 tablet 0   No facility-administered medications prior to visit.         Objective:   Physical Exam  Vitals:   12/02/17 1420  BP: 116/74  Pulse: 78  SpO2: 98%  Weight: 224 lb (101.6 kg)  Height: 5\' 9"  (1.753 m)   Gen: Pleasant, well-nourished, in no distress,  normal affect  ENT: No lesions,  mouth clear,  oropharynx clear, no postnasal drip  Neck: No JVD, no stridor  Lungs: No use of accessory muscles, somewhat distant.  No wheezing on a normal respiratory cycle but he does have wheeze on forced expiration  Cardiovascular: RRR, heart sounds normal, no murmur or gallops, no peripheral edema  Musculoskeletal: No deformities, no cyanosis or clubbing  Neuro: alert, non focal  Skin: Warm, no lesions or rash      Assessment & Plan:  Chronic obstructive pulmonary disease ([place]) Not currently on maintenance medications.  He has had some cough associated with Spiriva so this may not be a good choice for him.  We will repeat his pulmonary function testing to assess his degree of obstruction.  Based on that we will start him on schedule bronchodilator therapy, probably LABA/LAMA.  Keep albuterol available for him to use as needed.  He has had the Pneumovax, needs another one after age 53.  He is interested in getting the Prevnar-13.  Flu shot is up-to-date.  We will repeat your pulmonary function testing Do not restart your Spiriva at this time. Keep your albuterol  available to use 2 puffs up to every 4 hours if needed for shortness of breath, chest tightness, wheezing.  You can use this prior to exertion as well. Flu shot is up-to-date. Pneumovax was done 8 years ago.  You will need it again after age 59. Prevnar-13 to be given today. Follow with Dr Lamonte Sakai next available with full PFT on the same day.  History of smoking 30 or more pack years He has had his first screening CT scan in December 2018, was a RADS 1 scan.  He is due for repeat in December of this year.  I will enroll him  in our program so that he can be part of our database, follow-up.  Baltazar Apo, MD, PhD 12/02/2017, 2:48 PM [facility] [phone] or if no answer [phone]

## 2017-12-02 NOTE — Patient Instructions (Addendum)
We will repeat your pulmonary function testing Do not restart your Spiriva at this time. Keep your albuterol available to use 2 puffs up to every 4 hours if needed for shortness of breath, chest tightness, wheezing.  You can use this prior to exertion as well. Flu shot is up-to-date. Pneumovax was done 8 years ago.  You will need it again after age 62. Prevnar-13 to be given today. We will enroll you in our lung cancer screening program.  Your next screening CT scan is in December 2019 Follow with Dr Lamonte Sakai next available with full PFT on the same day.

## 2017-12-16 ENCOUNTER — Telehealth: Payer: Self-pay | Admitting: Emergency Medicine

## 2017-12-16 MED ORDER — ALBUTEROL SULFATE HFA 108 (90 BASE) MCG/ACT IN AERS: 2.0000 | INHALATION_SPRAY | RESPIRATORY_TRACT | 3 refills | Status: DC | PRN

## 2017-12-16 NOTE — Telephone Encounter (Signed)
Called and spoke to pt, who request Rx for proair to be sent to [facility].  Rx for proair has been sent to preferred pharmacy. Nothing further is needed.

## 2018-01-10 ENCOUNTER — Ambulatory Visit (HOSPITAL_BASED_OUTPATIENT_CLINIC_OR_DEPARTMENT_OTHER)
Admission: RE | Admit: 2018-01-10 | Discharge: 2018-01-10 | Disposition: A | Discharge status: Home / Self Care | Payer: BLUE CROSS/BLUE SHIELD | Source: Ambulatory Visit | Attending: Family Medicine | Admitting: Family Medicine

## 2018-01-10 ENCOUNTER — Encounter: Payer: Self-pay | Admitting: Family Medicine

## 2018-01-10 DIAGNOSIS — Z87891 Personal history of nicotine dependence: Secondary | ICD-10-CM | POA: Insufficient documentation

## 2018-01-10 DIAGNOSIS — I700 Atherosclerosis of aorta: Secondary | ICD-10-CM | POA: Insufficient documentation

## 2018-01-12 ENCOUNTER — Institutional Professional Consult (permissible substitution): Payer: BLUE CROSS/BLUE SHIELD | Admitting: Emergency Medicine

## 2018-02-01 ENCOUNTER — Ambulatory Visit (INDEPENDENT_AMBULATORY_CARE_PROVIDER_SITE_OTHER): Payer: BLUE CROSS/BLUE SHIELD | Admitting: Emergency Medicine

## 2018-02-01 ENCOUNTER — Ambulatory Visit (INDEPENDENT_AMBULATORY_CARE_PROVIDER_SITE_OTHER): Payer: Self-pay | Admitting: Emergency Medicine

## 2018-02-01 ENCOUNTER — Encounter: Payer: Self-pay | Admitting: Emergency Medicine

## 2018-02-01 DIAGNOSIS — J449 Chronic obstructive pulmonary disease, unspecified: Secondary | ICD-10-CM

## 2018-02-01 DIAGNOSIS — Z87891 Personal history of nicotine dependence: Secondary | ICD-10-CM

## 2018-02-01 LAB — PULMONARY FUNCTION TEST
DL/VA % pred: 96 %
DL/VA: 4.39 ml/min/mmHg/L
DLCO UNC % PRED: 84 %
DLCO unc: 25.77 ml/min/mmHg
FEF 25-75 PRE: 1.46 L/s
FEF 25-75 Post: 2.15 L/sec
FEF2575-%CHANGE-POST: 47 %
FEF2575-%PRED-PRE: 53 %
FEF2575-%Pred-Post: 79 %
FEV1-%CHANGE-POST: 12 %
FEV1-%PRED-POST: 76 %
FEV1-%Pred-Pre: 68 %
FEV1-PRE: 2.29 L
FEV1-Post: 2.57 L
FEV1FVC-%Change-Post: 7 %
FEV1FVC-%PRED-PRE: 92 %
FEV6-%CHANGE-POST: 9 %
FEV6-%PRED-POST: 81 %
FEV6-%Pred-Pre: 74 %
FEV6-Post: 3.45 L
FEV6-Pre: 3.16 L
FEV6FVC-%CHANGE-POST: 2 %
FEV6FVC-%PRED-POST: 105 %
FEV6FVC-%Pred-Pre: 102 %
FVC-%CHANGE-POST: 4 %
FVC-%PRED-POST: 77 %
FVC-%Pred-Pre: 74 %
FVC-POST: 3.46 L
FVC-Pre: 3.31 L
POST FEV1/FVC RATIO: 74 %
PRE FEV1/FVC RATIO: 69 %
PRE FEV6/FVC RATIO: 98 %
Post FEV6/FVC ratio: 100 %
RV % PRED: 138 %
RV: 3.08 L
TLC % pred: 102 %
TLC: 6.9 L

## 2018-02-01 MED ORDER — TIOTROPIUM BROMIDE-OLODATEROL 2.5-2.5 MCG/ACT IN AERS: 2.0000 | INHALATION_SPRAY | Freq: Every day | RESPIRATORY_TRACT | 0 refills | Status: AC

## 2018-02-01 NOTE — Assessment & Plan Note (Signed)
He had lung cancer screening CT done in December, RADS 2, stable.  Needs another one in 12 months.  I will incorporate him into our lung cancer screening program

## 2018-02-01 NOTE — Addendum Note (Signed)
Addended by: Desmond Dike C on: 02/01/2018 04:30 PM   Modules accepted: Orders

## 2018-02-01 NOTE — Progress Notes (Signed)
Patient seen in the office today and instructed on use of Stiolto Respimat.  Patient expressed understanding and demonstrated technique.  

## 2018-02-01 NOTE — Assessment & Plan Note (Signed)
We will try starting Stiolto 2 puffs once daily.  Call our office to let us know if you have benefited from this medication.  If so we will order it for you through your pharmacy. Keep your albuterol available to use 2 puffs up to every 4 hours if needed for shortness of breath, chest tightness, wheezing. Walking oximetry today on room air. Immunizations up-to-date Follow with Dr Lamonte Sakai in 6 months or sooner if you have any problems

## 2018-02-01 NOTE — Patient Instructions (Addendum)
We will try starting Stiolto 2 puffs once daily.  Call our office to let us know if you have benefited from this medication.  If so we will order it for you through your pharmacy. Keep your albuterol available to use 2 puffs up to every 4 hours if needed for shortness of breath, chest tightness, wheezing. Walking oximetry today on room air. Immunizations up-to-date Your CT scan of the chest was stable.  We will continue your lung cancer screening in 1 year. Follow with Dr Lamonte Sakai in 6 months or sooner if you have any problems

## 2018-02-01 NOTE — Progress Notes (Signed)
Subjective:    Patient ID: Jacob Frost, male    DOB: [birth year], 63 y.o.   MRN: [id number]  HPI 64 year old former smoker (66 + pack years) with a history of hypertension, hypercholesterolemia.  He carries a diagnosis of COPD that was made at [facility].  He has been given Spiriva but has not taken it, has albuterol which he uses rarely, usually before exercise.  He is referred today to establish care. He has seen Pulmonary at [facility], Dr Greggory Stallion. He has SOB after a city block. He has some intermittent cough, happens after Spiriva especially. Non productive. No CP, does hear some wheeze, random.   LDCT at [facility] 12/2016 > RADS 1 scan, reassuring. He is scheduled for repeat in December.  He has had pneumovax at age 69.  Flu shot   ROV 02/01/18 --follow-up visit for patient with history of tobacco and presumed COPD.  We repeated his pulmonary function testing today and I have reviewed.  This shows moderately severe obstruction with a positive bronchodilator response.  His lung volumes are hyperinflated, diffusion capacity is normal.  He also had a repeat low-dose screening CT scan of the chest done on 01/10/2018 that I reviewed.  This shows small right lung nodules up to 2.9 mm, calcified granuloma in the mid right lung at 1 mm, overall a RADS 2 study.  Plan for repeat in 12 months.    Review of Systems  Constitutional: Negative for fever and unexpected weight change.  HENT: Negative for congestion, dental problem, ear pain, nosebleeds, postnasal drip, rhinorrhea, sinus pressure, sneezing, sore throat and trouble swallowing.   Eyes: Negative for redness and itching.  Respiratory: Positive for cough, chest tightness, shortness of breath and wheezing.   Cardiovascular: Negative for palpitations and leg swelling.  Gastrointestinal: Negative for nausea and vomiting.  Genitourinary: Negative for dysuria.  Musculoskeletal: Negative for joint swelling.  Skin: Negative for rash.    Neurological: Negative for headaches.  Hematological: Does not bruise/bleed easily.  Psychiatric/Behavioral: Negative for dysphoric mood. The patient is not nervous/anxious.     Past Medical History:  Diagnosis Date  . Aortic atherosclerosis ([place])   . COPD (chronic obstructive pulmonary disease) ([place])   . Hyperlipidemia   . Hypertension      Family History  Problem Relation Age of Onset  . Diabetes Father   . Diabetes Sister   . Hypertension Sister      Social History   Socioeconomic History  . Marital status: Single    Spouse name: Not on file  . Number of children: Not on file  . Years of education: Not on file  . Highest education level: Not on file  Occupational History  . Not on file  Social Needs  . Financial resource strain: Not on file  . Food insecurity:    Worry: Not on file    Inability: Not on file  . Transportation needs:    Medical: Not on file    Non-medical: Not on file  Tobacco Use  . Smoking status: Former Smoker    Types: Cigarettes    Start date: 07/16/1968    Last attempt to quit: 07/16/2005    Years since quitting: 12.5  . Smokeless tobacco: Never Used  Substance and Sexual Activity  . Alcohol use: Never    Frequency: Never  . Drug use: Never  . Sexual activity: Not on file  Lifestyle  . Physical activity:    Days per week: Not on file    Minutes  per session: Not on file  . Stress: Not on file  Relationships  . Social connections:    Talks on phone: Not on file    Gets together: Not on file    Attends religious service: Not on file    Active member of club or organization: Not on file    Attends meetings of clubs or organizations: Not on file    Relationship status: Not on file  . Intimate partner violence:    Fear of current or ex partner: Not on file    Emotionally abused: Not on file    Physically abused: Not on file    Forced sexual activity: Not on file  Other Topics Concern  . Not on file  Social History Narrative  .  Not on file    Has worked in Psychologist, educational, has worked for city of [employer], grounds crew.  Has worked in [employer] [place], also [employer], has lived in [place] as well.    No Known Allergies   Outpatient Medications Prior to Visit  Medication Sig Dispense Refill  . albuterol (PROAIR HFA) 108 (90 Base) MCG/ACT inhaler Inhale 2 puffs into the lungs every 4 (four) hours as needed for wheezing or shortness of breath. 1 Inhaler 3  . atorvastatin (LIPITOR) 20 MG tablet Take 1 tablet (20 mg total) by mouth daily. 90 tablet 3  . clobetasol (OLUX) 0.05 % topical foam Apply topically 2 (two) times daily. 100 g 0  . ketoconazole (NIZORAL) 2 % shampoo Apply 1 application topically 2 (two) times a week. 120 mL 0  . lisinopril-hydrochlorothiazide (PRINZIDE,ZESTORETIC) 20-25 MG tablet Take 1 tablet by mouth daily 90 tablet 0  . sildenafil (VIAGRA) 100 MG tablet TAKE 1 TABLET BY MOUTH ONCE DAILY AS NEEDED 30 tablet 0  . tiotropium (SPIRIVA) 18 MCG inhalation capsule Place 18 mcg into inhaler and inhale daily.    Marland Kitchen zolpidem (AMBIEN) 10 MG tablet Take 1 tablet (10 mg total) by mouth at bedtime as needed for sleep. 30 tablet 0  . albuterol (PROVENTIL HFA;VENTOLIN HFA) 108 (90 Base) MCG/ACT inhaler Inhale into the lungs every 6 (six) hours as needed for wheezing or shortness of breath.     No facility-administered medications prior to visit.         Objective:   Physical Exam  Vitals:   02/01/18 1555  BP: 112/80  Pulse: 94  SpO2: 98%  Weight: 221 lb (100.2 kg)  Height: 5' 8.7" (1.745 m)   Gen: Pleasant, well-nourished, in no distress,  normal affect  ENT: No lesions,  mouth clear,  oropharynx clear, no postnasal drip  Neck: No JVD, no stridor  Lungs: No use of accessory muscles, somewhat distant.  No wheezing on a normal respiratory cycle but he does have wheeze on forced expiration  Cardiovascular: RRR, heart sounds normal, no murmur or gallops, no peripheral edema  Musculoskeletal:  No deformities, no cyanosis or clubbing  Neuro: alert, non focal  Skin: Warm, no lesions or rash      Assessment & Plan:  History of smoking 30 or more pack years He had lung cancer screening CT done in December, RADS 2, stable.  Needs another one in 12 months.  I will incorporate him into our lung cancer screening program  Chronic obstructive pulmonary disease ([place]) We will try starting Stiolto 2 puffs once daily.  Call our office to let us know if you have benefited from this medication.  If so we will order it for  you through your pharmacy. Keep your albuterol available to use 2 puffs up to every 4 hours if needed for shortness of breath, chest tightness, wheezing. Walking oximetry today on room air. Immunizations up-to-date Follow with Dr Lamonte Sakai in 6 months or sooner if you have any problems  Baltazar Apo, MD, PhD 02/01/2018, 4:27 PM [place] [phone] or if no answer [phone]

## 2018-02-01 NOTE — Progress Notes (Signed)
PFT done today. 

## 2018-04-20 ENCOUNTER — Other Ambulatory Visit: Payer: Self-pay

## 2018-04-20 ENCOUNTER — Ambulatory Visit (INDEPENDENT_AMBULATORY_CARE_PROVIDER_SITE_OTHER): Payer: Self-pay | Admitting: Family Medicine

## 2018-04-20 ENCOUNTER — Encounter: Payer: Self-pay | Admitting: Family Medicine

## 2018-04-20 VITALS — BP 120/80 | HR 93 | Temp 98.3°F | Ht 69.0 in | Wt 234.5 lb

## 2018-04-20 DIAGNOSIS — E785 Hyperlipidemia, unspecified: Secondary | ICD-10-CM

## 2018-04-20 DIAGNOSIS — I10 Essential (primary) hypertension: Secondary | ICD-10-CM

## 2018-04-20 LAB — LIPID PANEL
CHOLESTEROL: 134 mg/dL (ref 0–200)
HDL: 28.3 mg/dL — ABNORMAL LOW (ref >39.00)
LDL Cholesterol: 75 mg/dL (ref 0–99)
NonHDL: 105.55
Total CHOL/HDL Ratio: 5
Triglycerides: 152 mg/dL — ABNORMAL HIGH (ref 0.0–149.0)
VLDL: 30.4 mg/dL (ref 0.0–40.0)

## 2018-04-20 MED ORDER — ATORVASTATIN CALCIUM 40 MG PO TABS: 20.0000 mg | ORAL_TABLET | Freq: Every day | ORAL | 1 refills | Status: DC

## 2018-04-20 MED ORDER — LISINOPRIL-HYDROCHLOROTHIAZIDE 20-25 MG PO TABS: 0.5000 | ORAL_TABLET | Freq: Every day | ORAL | 1 refills | Status: DC

## 2018-04-20 NOTE — Patient Instructions (Addendum)
Give us 2-3 business days to get the results of your labs back.   Keep the diet clean and stay active.  Because your blood pressure is well-controlled, you no longer have to check your blood pressure at home anymore unless you wish. Some people check it twice daily every day and some people stop altogether. Either or anything in between is fine. Strong work!  Let us know if you need anything. 

## 2018-04-20 NOTE — Progress Notes (Signed)
Chief Complaint  Patient presents with  . Follow-up  . Cough  . Headache    Subjective Jacob Frost is a 63 y.o. male who presents for hypertension follow up. He does not monitor home blood pressures. He is compliant with medication- Prinzide 10-12.5 mg/d. Patient has these side effects of medication: none He is not adhering to a healthy diet overall.  Current exercise: walking  Hyperlipidemia Patient presents for dyslipidemia follow up. Currently being treated with Lipitor 20 mg/d and compliance with treatment thus far has been good. He denies myalgias. He is adhering to a healthy. Exercise: some walking The patient is not known to have coexisting coronary artery disease.    Past Medical History:  Diagnosis Date  . Aortic atherosclerosis ([place])   . COPD (chronic obstructive pulmonary disease) ([place])   . Hyperlipidemia   . Hypertension     Review of Systems Cardiovascular: no chest pain  Exam BP 120/80 (BP Location: Left Arm, Patient Position: Sitting, Cuff Size: Large)   Pulse 93   Temp 98.3 F (36.8 C) (Oral)   Ht 5\' 9"  (1.753 m)   Wt 234 lb 8 oz (106.4 kg)   SpO2 94%   BMI 34.63 kg/m  General:  well developed, well nourished, in no apparent distress Heart: RRR, no bruits, no LE edema Lungs: clear to auscultation, no accessory muscle use Psych: well oriented with normal range of affect and appropriate judgment/insight  Essential hypertension - Plan: lisinopril-hydrochlorothiazide (PRINZIDE,ZESTORETIC) 20-25 MG tablet  Hyperlipidemia, unspecified hyperlipidemia type - Plan: atorvastatin (LIPITOR) 40 MG tablet, Lipid panel  Orders as above. Taking half tabs of above. Counseled on diet and exercise. Ck lipids today. May need to increase dosage.  F/u in 1 yr or when he gets Medicare. The patient voiced understanding and agreement to the plan.  [place], DO 04/20/18  9:26 AM

## 2018-05-09 ENCOUNTER — Other Ambulatory Visit: Payer: Self-pay | Admitting: Family Medicine

## 2018-05-09 DIAGNOSIS — E785 Hyperlipidemia, unspecified: Secondary | ICD-10-CM

## 2018-05-09 MED ORDER — ATORVASTATIN CALCIUM 40 MG PO TABS: 40.0000 mg | ORAL_TABLET | Freq: Every day | ORAL | 2 refills | Status: DC

## 2018-05-09 NOTE — Telephone Encounter (Signed)
The patient left a message saying that this med was to be increased to 40mg  per day.  He is requesting a 90 day supply and he is using a different pharmacy and the pharmacy has requested that the prescription be faxed to [phone]  [facility]

## 2018-08-06 ENCOUNTER — Other Ambulatory Visit: Payer: Self-pay | Admitting: Family Medicine

## 2018-08-06 DIAGNOSIS — E785 Hyperlipidemia, unspecified: Secondary | ICD-10-CM

## 2018-10-20 ENCOUNTER — Other Ambulatory Visit: Payer: Self-pay

## 2018-10-20 ENCOUNTER — Encounter: Payer: Self-pay | Admitting: Emergency Medicine

## 2018-10-20 ENCOUNTER — Ambulatory Visit (INDEPENDENT_AMBULATORY_CARE_PROVIDER_SITE_OTHER): Payer: Self-pay | Admitting: Emergency Medicine

## 2018-10-20 VITALS — BP 102/78 | HR 71 | Ht 69.0 in | Wt 205.0 lb

## 2018-10-20 DIAGNOSIS — Z122 Encounter for screening for malignant neoplasm of respiratory organs: Secondary | ICD-10-CM

## 2018-10-20 DIAGNOSIS — Z87891 Personal history of nicotine dependence: Secondary | ICD-10-CM

## 2018-10-20 NOTE — Assessment & Plan Note (Signed)
Improved and overall stable.  Tolerating and benefiting from [employer].  We will continue.  Albuterol as needed.  He is getting his flu shot next week and his pneumonia vaccine is up-to-date.  We will continue Stiolto 2 puffs once daily. Keep albuterol available to use 2 puffs up to every 4 hours if needed for shortness of breath, chest tightness, wheezing.  Get the flu shot this month as planned Your pneumonia shot is up-to-date until after age 64 Follow with Dr. Lamonte Sakai in 12 months or sooner if you have any problems.

## 2018-10-20 NOTE — Assessment & Plan Note (Signed)
Due for a repeat CT chest for lung cancer screening in December.  We will arrange

## 2018-10-20 NOTE — Patient Instructions (Addendum)
We will continue Stiolto 2 puffs once daily. Keep albuterol available to use 2 puffs up to every 4 hours if needed for shortness of breath, chest tightness, wheezing.  We will arrange for your repeat low-dose CT scan of the chest in December to complete your lung cancer screening. Get the flu shot this month as planned Your pneumonia shot is up-to-date until after age 64 Follow with Dr. Lamonte Sakai in 12 months or sooner if you have any problems.

## 2018-10-20 NOTE — Progress Notes (Signed)
Subjective:    Patient ID: Jacob Frost, male    DOB: September 11, 1955, 63 y.o.   MRN: [id number]  HPI 63 year old former smoker (84 + pack years) with a history of hypertension, hypercholesterolemia.  He carries a diagnosis of COPD that was made at [facility].  He has been given Spiriva but has not taken it, has albuterol which he uses rarely, usually before exercise.  He is referred today to establish care. He has seen Pulmonary at [facility], Dr Greggory Stallion. He has SOB after a city block. He has some intermittent cough, happens after Spiriva especially. Non productive. No CP, does hear some wheeze, random.   LDCT at [facility] 12/2016 > RADS 1 scan, reassuring. He is scheduled for repeat in December.  He has had pneumovax at age 54.  Flu shot   ROV 02/01/18 --follow-up visit for patient with history of tobacco and presumed COPD.  We repeated his pulmonary function testing today and I have reviewed.  This shows moderately severe obstruction with a positive bronchodilator response.  His lung volumes are hyperinflated, diffusion capacity is normal.  He also had a repeat low-dose screening CT scan of the chest done on 01/10/2018 that I reviewed.  This shows small right lung nodules up to 2.9 mm, calcified granuloma in the mid right lung at 1 mm, overall a RADS 2 study.  Plan for repeat in 12 months.   ROV 10/20/2018 --63 year old man with history of COPD, moderately severe obstruction and a positive bronchodilator response on his spirometry.  He also has small pulmonary nodules, calcified granulomata that we are following on low-dose CT scan screening (next in December 2020).  He returns today reporting that he likes the [facility] - feels that it has been beneficial. Less SOB, no cough or wheeze. No flares since last time. He notes that his exercise tolerance is lower than several years ago. He does have SOB when he walks an extended distance. He has lost about 15lbs since last time. He had a garden this year.  No desat on walk last visit. He needs LDCT in 12/2018.    Review of Systems  Constitutional: Negative for fever and unexpected weight change.  HENT: Negative for congestion, dental problem, ear pain, nosebleeds, postnasal drip, rhinorrhea, sinus pressure, sneezing, sore throat and trouble swallowing.   Eyes: Negative for redness and itching.  Respiratory: Positive for cough, chest tightness, shortness of breath and wheezing.   Cardiovascular: Negative for palpitations and leg swelling.  Gastrointestinal: Negative for nausea and vomiting.  Genitourinary: Negative for dysuria.  Musculoskeletal: Negative for joint swelling.  Skin: Negative for rash.  Neurological: Negative for headaches.  Hematological: Does not bruise/bleed easily.  Psychiatric/Behavioral: Negative for dysphoric mood. The patient is not nervous/anxious.         Objective:   Physical Exam  Vitals:   10/20/18 1412  BP: 102/78  Pulse: 71  SpO2: 95%  Weight: 205 lb (93 kg)  Height: 5\' 9"  (1.753 m)   Gen: Pleasant, well-nourished, in no distress,  normal affect  ENT: No lesions,  mouth clear,  oropharynx clear, no postnasal drip  Neck: No JVD, he does have some stridor on a forced exp  Lungs: No use of accessory muscles, somewhat distant.  No wheezing on a normal respiratory cycle  Or on forced exp  Cardiovascular: RRR, heart sounds normal, no murmur or gallops, no peripheral edema  Musculoskeletal: No deformities, no cyanosis or clubbing  Neuro: alert, non focal  Skin: Warm, no lesions or rash  Assessment & Plan:  History of smoking 30 or more pack years Due for a repeat CT chest for lung cancer screening in December.  We will arrange  Chronic obstructive pulmonary disease (HCC) Improved and overall stable.  Tolerating and benefiting from [employer].  We will continue.  Albuterol as needed.  He is getting his flu shot next week and his pneumonia vaccine is up-to-date.  We will continue Stiolto 2  puffs once daily. Keep albuterol available to use 2 puffs up to every 4 hours if needed for shortness of breath, chest tightness, wheezing.  Get the flu shot this month as planned Your pneumonia shot is up-to-date until after age 7 Follow with Dr. Lamonte Sakai in 12 months or sooner if you have any problems.   Baltazar Apo, MD, PhD 10/20/2018, 2:36 PM [facility] [phone] or if no answer [phone]

## 2018-10-24 ENCOUNTER — Encounter: Payer: Self-pay | Admitting: Family Medicine

## 2018-10-24 ENCOUNTER — Ambulatory Visit (INDEPENDENT_AMBULATORY_CARE_PROVIDER_SITE_OTHER): Payer: Self-pay | Admitting: Family Medicine

## 2018-10-24 ENCOUNTER — Other Ambulatory Visit: Payer: Self-pay

## 2018-10-24 VITALS — BP 108/62 | HR 79 | Temp 97.9°F | Ht 69.0 in | Wt 206.1 lb

## 2018-10-24 DIAGNOSIS — E785 Hyperlipidemia, unspecified: Secondary | ICD-10-CM

## 2018-10-24 DIAGNOSIS — I10 Essential (primary) hypertension: Secondary | ICD-10-CM

## 2018-10-24 DIAGNOSIS — Z23 Encounter for immunization: Secondary | ICD-10-CM

## 2018-10-24 LAB — BASIC METABOLIC PANEL
BUN: 14 mg/dL (ref 6–23)
CO2: 30 mEq/L (ref 19–32)
Calcium: 10 mg/dL (ref 8.4–10.5)
Chloride: 103 mEq/L (ref 96–112)
Creatinine, Ser: 0.84 mg/dL (ref 0.40–1.50)
GFR: 91.97 mL/min (ref >60.00)
Glucose, Bld: 88 mg/dL (ref 70–99)
Potassium: 3.9 mEq/L (ref 3.5–5.1)
Sodium: 141 mEq/L (ref 135–145)

## 2018-10-24 MED ORDER — LISINOPRIL 10 MG PO TABS: 10.0000 mg | ORAL_TABLET | Freq: Every day | ORAL | 3 refills | Status: DC

## 2018-10-24 NOTE — Patient Instructions (Addendum)
Keep the diet clean and stay active.  Aim to do some physical exertion for 150 minutes per week. This is typically divided into 5 days per week, 30 minutes per day. The activity should be enough to get your heart rate up. Anything is better than nothing if you have time constraints.  Give [place] 2-3 business days to get the results of your labs back.    I want your blood pressure <150/90. If it gets lower than 100 on the top or 60 on the bottom, let me know.   Let us know if you need anything.

## 2018-10-24 NOTE — Progress Notes (Signed)
Chief Complaint  Patient presents with  . Follow-up    Subjective Jacob Frost is a 63 y.o. male who presents for hypertension follow up. He does not monitor home blood pressures. He is compliant with medications- Prinzide 10-12.5 mg/d. Patient has these side effects of medication: none He is adhering to a healthy diet overall. Current exercise: gardening, none  Hyperlipidemia Patient presents for dyslipidemia follow up. Currently being treated with Lipitor 20 mg/d and compliance with treatment thus far has been good. He denies myalgias. He is adhering to a healthy diet. Exercise: gardening The patient is not known to have coexisting coronary artery disease.    Past Medical History:  Diagnosis Date  . Aortic atherosclerosis ([place])   . COPD (chronic obstructive pulmonary disease) ([place])   . Hyperlipidemia   . Hypertension     Review of Systems Cardiovascular: no chest pain Respiratory:  no shortness of breath  Exam BP 108/62 (BP Location: Left Arm, Patient Position: Sitting, Cuff Size: Normal)   Pulse 79   Temp 97.9 F (36.6 C) (Temporal)   Ht 5\' 9"  (1.753 m)   Wt 206 lb 2 oz (93.5 kg)   SpO2 94%   BMI 30.44 kg/m  General:  well developed, well nourished, in no apparent distress Heart: RRR, no bruits, no LE edema Lungs: clear to auscultation, no accessory muscle use Psych: well oriented with normal range of affect and appropriate judgment/insight  Essential hypertension - Plan: Basic metabolic panel  Hyperlipidemia, unspecified hyperlipidemia type  Need for influenza vaccination - Plan: Flu Vaccine QUAD 6+ mos PF IM (Fluarix Quad PF)  Stop prinzide, change to lisinopril 10 mg/d only. Monitor BP at home. Con statin.  Counseled on diet and exercise. No ins currently.  F/u in 6 mo. The patient voiced understanding and agreement to the plan.  [place], DO 10/24/18  1:40 PM

## 2018-11-01 ENCOUNTER — Other Ambulatory Visit: Payer: Self-pay | Admitting: Family Medicine

## 2018-11-01 MED ORDER — LISINOPRIL 10 MG PO TABS: 10.0000 mg | ORAL_TABLET | Freq: Every day | ORAL | 3 refills | Status: DC

## 2018-11-01 NOTE — Telephone Encounter (Signed)
Medication Refill - Medication:  lisinopril (ZESTRIL) 10 MG tablet   Has the patient contacted their pharmacy? Yes advised to contact office as they no longer have medication on file.   Preferred Pharmacy (with phone number or street name):  [facility]  Agent: Please be advised that RX refills may take up to 3 business days. We ask that you follow-up with your pharmacy.

## 2018-11-01 NOTE — Telephone Encounter (Signed)
Patient states he had prescription transferred to [facility] because he thought it was less expensive there, but needs sent back to [facility] in [place] instead.   Requested Prescriptions  Pending Prescriptions Disp Refills  . lisinopril (ZESTRIL) 10 MG tablet 90 tablet 3    Sig: Take 1 tablet (10 mg total) by mouth daily.     Cardiovascular:  ACE Inhibitors Passed - 11/01/2018  4:18 PM      Passed - Cr in normal range and within 180 days    Creatinine, Ser  Date Value Ref Range Status  10/24/2018 0.84 0.40 - 1.50 mg/dL Final         Passed - K in normal range and within 180 days    Potassium  Date Value Ref Range Status  10/24/2018 3.9 3.5 - 5.1 mEq/L Final         Passed - Patient is not pregnant      Passed - Last BP in normal range    BP Readings from Last 1 Encounters:  10/24/18 108/62         Passed - Valid encounter within last 6 months    Recent Outpatient Visits          1 week ago Essential hypertension   [facility], [place], DO   6 months ago Essential hypertension   [facility] at [facility], [place], DO   1 year ago Well adult exam   [facility] at [facility], [place], DO   1 year ago Essential hypertension   [facility]ivist at [facility], [place], DO      Future Appointments            In 5 months [place], [place], [place] at [facility], [facility]

## 2019-01-12 ENCOUNTER — Ambulatory Visit (HOSPITAL_BASED_OUTPATIENT_CLINIC_OR_DEPARTMENT_OTHER)
Admission: RE | Admit: 2019-01-12 | Discharge: 2019-01-12 | Disposition: A | Discharge status: Home / Self Care | Payer: Medicare Other | Source: Ambulatory Visit | Attending: Emergency Medicine | Admitting: Emergency Medicine

## 2019-01-12 ENCOUNTER — Other Ambulatory Visit: Payer: Self-pay

## 2019-01-12 DIAGNOSIS — Z87891 Personal history of nicotine dependence: Secondary | ICD-10-CM | POA: Diagnosis present

## 2019-01-12 DIAGNOSIS — Z122 Encounter for screening for malignant neoplasm of respiratory organs: Secondary | ICD-10-CM

## 2019-02-02 ENCOUNTER — Telehealth: Payer: Self-pay

## 2019-02-02 NOTE — Telephone Encounter (Signed)
Copied from [place] [id number]. Topic: General - Other >> Feb 02, 2019  9:51 AM Leward Quan A wrote: Reason for CRM: Patient called to inquire of Dr Nani Ravens if he can get a Certificate of permament disability for property tax purpose. If this can be done please call for pick up. Please call Ph# [phone]

## 2019-02-03 NOTE — Telephone Encounter (Signed)
I don't do those evaluations. Ty.

## 2019-02-03 NOTE — Telephone Encounter (Signed)
Patient informed of PCP response °

## 2019-02-08 ENCOUNTER — Telehealth: Payer: Self-pay | Admitting: Emergency Medicine

## 2019-02-08 NOTE — Telephone Encounter (Signed)
Form has been signed by RB. Pt is aware. These have been placed up front for pick up.

## 2019-02-08 NOTE — Telephone Encounter (Signed)
Spoke with pt and advised him that he would need to bring the papers here to the office so we can send them to [facility]. Pt understood and will drop them off here today. Nothing further is needed.

## 2019-02-08 NOTE — Telephone Encounter (Signed)
Per Ria Comment, these are NOT disability or FMLA forms They are forms regarding his property tax  Ria Comment has forms and has filled out and will have Dr Lamonte Sakai sign  Will forward to her to f/u on, thanks!

## 2019-03-06 ENCOUNTER — Other Ambulatory Visit: Payer: Self-pay | Admitting: Family Medicine

## 2019-03-06 MED ORDER — ZOLPIDEM TARTRATE 10 MG PO TABS: 10.0000 mg | ORAL_TABLET | Freq: Every evening | ORAL | 0 refills | Status: DC | PRN

## 2019-03-06 NOTE — Telephone Encounter (Signed)
Requesting:  zolpidem   Contract:  none UDS:   none Last Visit:  10/24/2018 Next Visit:    04/24/2019 Last Refill:   #30 no refills on 11/08/2017  Please Advise

## 2019-03-06 NOTE — Telephone Encounter (Signed)
Sent to local pharm.

## 2019-03-06 NOTE — Telephone Encounter (Signed)
Medication:  Zolpidem (ambien) 10mg  tablet  Has the patient contacted their pharmacy? No.   Preferred Pharmacy   [address], [place], [place] [address]  [phone]       Agent: Please be advised that RX refills may take up to 3 business days. We ask that you follow-up with your pharmacy.

## 2019-03-09 ENCOUNTER — Telehealth: Payer: Self-pay

## 2019-03-09 MED ORDER — ZOLPIDEM TARTRATE 10 MG PO TABS: 10.0000 mg | ORAL_TABLET | Freq: Every evening | ORAL | 0 refills | Status: DC | PRN

## 2019-03-09 NOTE — Telephone Encounter (Signed)
Patient notified that rx was sent in  

## 2019-03-09 NOTE — Telephone Encounter (Signed)
Previous refill request was sent to go to the [facility] on [address]  for 90 day supply.Marland Kitchen However it looks like it went to [facility] instead for only 30 days.  Please correct is possible and have refill go to [facility]  [address] [place]  [phone] zolpidem (AMBIEN) 10 MG tablet     Thanks

## 2019-03-09 NOTE — Telephone Encounter (Signed)
Patient called in to see why his prescription has not been called in from Dr. Nani Ravens for zolpidem (AMBIEN) 10 MG tablet [id number] day supply     The patient would like a follow up call  As soon as possible at [phone]   Thanks,

## 2019-03-09 NOTE — Addendum Note (Signed)
Addended by: Ames Coupe on: 03/09/2019 02:24 PM   Modules accepted: Orders

## 2019-03-09 NOTE — Telephone Encounter (Signed)
See other note

## 2019-03-09 NOTE — Telephone Encounter (Signed)
I sent in 30 tabs, that's what I did last time. I know it lasts him >1 yr, let me know if he wants fewer though. Ty.

## 2019-04-24 ENCOUNTER — Ambulatory Visit: Payer: Self-pay | Admitting: Family Medicine

## 2019-05-01 ENCOUNTER — Other Ambulatory Visit: Payer: Self-pay

## 2019-05-01 ENCOUNTER — Encounter: Payer: Self-pay | Admitting: Family Medicine

## 2019-05-01 ENCOUNTER — Ambulatory Visit (INDEPENDENT_AMBULATORY_CARE_PROVIDER_SITE_OTHER): Payer: Medicare HMO | Admitting: Family Medicine

## 2019-05-01 VITALS — BP 127/84 | HR 85 | Temp 99.1°F | Ht 69.0 in | Wt 232.0 lb

## 2019-05-01 DIAGNOSIS — R739 Hyperglycemia, unspecified: Secondary | ICD-10-CM

## 2019-05-01 DIAGNOSIS — E785 Hyperlipidemia, unspecified: Secondary | ICD-10-CM | POA: Diagnosis not present

## 2019-05-01 DIAGNOSIS — I10 Essential (primary) hypertension: Secondary | ICD-10-CM

## 2019-05-01 DIAGNOSIS — N529 Male erectile dysfunction, unspecified: Secondary | ICD-10-CM | POA: Diagnosis not present

## 2019-05-01 DIAGNOSIS — Z6834 Body mass index (BMI) 34.0-34.9, adult: Secondary | ICD-10-CM | POA: Diagnosis not present

## 2019-05-01 DIAGNOSIS — J449 Chronic obstructive pulmonary disease, unspecified: Secondary | ICD-10-CM

## 2019-05-01 NOTE — Patient Instructions (Addendum)
Nice meeting you today.  Return for lab only visit, then 66-month follow-up unless any concerns on your labs.  Please let me know if you need any medication refills during that time.  With changes in diet, increase exercise and work in the garden I expect weight will improve.  Let me know if you have any concerns.  Take care!    If you have lab work done today you will be contacted with your lab results within the next 2 weeks.  If you have not heard from [place] then please contact us. The fastest way to get your results is to register for My Chart.   IF you received an x-ray today, you will receive an invoice from [facility]. Please contact [facility] at [phone] with questions or concerns regarding your invoice.   IF you received labwork today, you will receive an invoice from [place]. Please contact [facility] at [phone] with questions or concerns regarding your invoice.   Our billing staff will not be able to assist you with questions regarding bills from these companies.  You will be contacted with the lab results as soon as they are available. The fastest way to get your results is to activate your My Chart account. Instructions are located on the last page of this paperwork. If you have not heard from [place] regarding the results in 2 weeks, please contact this office.

## 2019-05-01 NOTE — Progress Notes (Signed)
Subjective:  Patient ID: Jacob Frost, male    DOB: 1955-11-16  Age: 64 y.o. MRN: [id number]  CC:  Chief Complaint  Patient presents with  . Establish Care    pt states he feels good with no complants. pt states to his knowledges his chronic conditions are well controlled.    HPI Jacob Frost presents for  New patient to establish care.  Previously under the care of Dr. Nani Ravens with last appointment in September 2020 reviewed.  History of hyperlipidemia, hypertension, hypogonadism, COPD, osteoporosis, erectile dysfunction, history of tobacco use, aortic atherosclerosis, insomnia. Retired from [employer].   Hypertension: Lisinopril 10 mg daily.  Previously on Prinzide 10/12.5 mg changed to just lisinopril in September 2020. No new side effects with meds.  Home readings:none recently.  BP Readings from Last 3 Encounters:  05/01/19 127/84  10/24/18 108/62  10/20/18 102/78   Lab Results  Component Value Date   CREATININE 0.84 10/24/2018   Hyperlipidemia: Lipitor 40 mg daily.  No new side effects.  Not fasting today.  Lab Results  Component Value Date   CHOL 134 04/20/2018   HDL 28.30 (L) 04/20/2018   LDLCALC 75 04/20/2018   LDLDIRECT 89.0 10/20/2017   TRIG 152.0 (H) 04/20/2018   CHOLHDL 5 04/20/2018   Lab Results  Component Value Date   ALT 45 10/20/2017   AST 24 10/20/2017   ALKPHOS 109 10/20/2017   BILITOT 0.6 10/20/2017   COPD Former smoker, 1970- 2007, cigarettes. Takes Stiolto, albuterol as needed. On disability - social security disability. Pulmonologist Dr. Lamonte Sakai  Erectile dysfunction Treated previously with Viagra - rarely taken. Full pill. Still has some at home. Some head flushing/HA. No vision/hearing changes or chest pain with exertion.   Insomnia: Rare use of Ambien - less than once per month. Works well.  Obesity: Prior walking 2.5 mi per day, but less walking lately. Has some diet indiscretion. Planning on more  garden work with change in weather.  Wt Readings from Last 3 Encounters:  05/01/19 232 lb (105.2 kg)  10/24/18 206 lb 2 oz (93.5 kg)  10/20/18 205 lb (93 kg)  Body mass index is 34.26 kg/m. Hyperglycemia of 100 in 09/2017. Normal in 2020.    Rare use olux foam for seborrhea. Not needing nizoral.   History Patient Active Problem List   Diagnosis Date Noted  . Aortic atherosclerosis ([place])   . History of smoking 30 or more pack years 10/20/2017  . HYPOGONADISM 07/19/2009  . 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 Hyperlipidemia 04/05/2009  . Essential hypertension 02/08/2009  . Chronic obstructive pulmonary disease ([place]) 02/08/2009  . OSTEOPOROSIS 02/08/2009  . COLONIC POLYPS, HX OF 02/08/2009   Past Medical History:  Diagnosis Date  . Aortic atherosclerosis ([place])   . COPD (chronic obstructive pulmonary disease) ([place])   . Emphysema of lung ([place])   . Hyperlipidemia   . Hypertension    No past surgical history on file. No Known Allergies Prior to Admission medications   Medication Sig Start Date End Date Taking? Authorizing Provider  albuterol (PROAIR HFA) 108 (90 Base) MCG/ACT inhaler Inhale 2 puffs into the lungs every 4 (four) hours as needed for wheezing or shortness of breath. 12/16/17  Yes Collene Gobble, MD  atorvastatin (LIPITOR) 40 MG tablet TAKE ONE TABLET BY MOUTH DAILY 08/08/18  Yes Shelda Pal, DO  clobetasol (OLUX) 0.05 % topical foam Apply topically 2 (two) times daily. 08/30/17  Yes Shelda Pal,  DO  ketoconazole (NIZORAL) 2 % shampoo Apply 1 application topically 2 (two) times a week. 10/21/17  Yes Shelda Pal, DO  lisinopril (ZESTRIL) 10 MG tablet Take 1 tablet (10 mg total) by mouth daily. 11/01/18  Yes Shelda Pal, DO  sildenafil (VIAGRA) 100 MG tablet TAKE 1 TABLET BY MOUTH ONCE DAILY AS NEEDED 11/29/17  Yes Shelda Pal, DO  tiotropium (SPIRIVA) 18 MCG inhalation capsule Place 18 mcg into inhaler and  inhale daily.   Yes [provider]  Tiotropium Bromide-Olodaterol (STIOLTO RESPIMAT) 2.5-2.5 MCG/ACT AERS Inhale 2 puffs into the lungs daily. 02/01/18  Yes Collene Gobble, MD  zolpidem (AMBIEN) 10 MG tablet Take 1 tablet (10 mg total) by mouth at bedtime as needed for sleep. 03/09/19  Yes Shelda Pal, DO   Social History   Socioeconomic History  . Marital status: Single    Spouse name: Not on file  . Number of children: Not on file  . Years of education: Not on file  . Highest education level: Not on file  Occupational History  . Not on file  Tobacco Use  . Smoking status: Former Smoker    Types: Cigarettes    Start date: 07/16/1968    Quit date: 07/16/2005    Years since quitting: 13.8  . Smokeless tobacco: Never Used  Substance and Sexual Activity  . Alcohol use: Never  . Drug use: Never  . Sexual activity: Not on file  Other Topics Concern  . Not on file  Social History Narrative  . Not on file   Social Determinants of Health   Financial Resource Strain:   . Difficulty of Paying Living Expenses:   Food Insecurity:   . Worried About Charity fundraiser in the Last Year:   . Arboriculturist in the Last Year:   Transportation Needs:   . Film/video editor (Medical):   Marland Kitchen Lack of Transportation (Non-Medical):   Physical Activity:   . Days of Exercise per Week:   . Minutes of Exercise per Session:   Stress:   . Feeling of Stress :   Social Connections:   . Frequency of Communication with Friends and Family:   . Frequency of Social Gatherings with Friends and Family:   . Attends Religious Services:   . Active Member of Clubs or Organizations:   . Attends Archivist Meetings:   Marland Kitchen Marital Status:   Intimate Partner Violence:   . Fear of Current or Ex-Partner:   . Emotionally Abused:   Marland Kitchen Physically Abused:   . Sexually Abused:     Review of Systems  Constitutional: Negative for fatigue and unexpected weight change.  Eyes: Negative  for visual disturbance.  Respiratory: Negative for cough, chest tightness and shortness of breath.   Cardiovascular: Negative for chest pain, palpitations and leg swelling.  Gastrointestinal: Negative for abdominal pain and blood in stool.  Neurological: Negative for dizziness, light-headedness and headaches.     Objective:   Vitals:   05/01/19 0934  BP: 127/84  Pulse: 85  Temp: 99.1 F (37.3 C)  TempSrc: Temporal  SpO2: 95%  Weight: 232 lb (105.2 kg)  Height: 5\' 9"  (1.753 m)     Physical Exam Vitals reviewed.  Constitutional:      Appearance: He is well-developed.  HENT:     Head: Normocephalic and atraumatic.  Eyes:     Pupils: Pupils are equal, round, and reactive to light.  Neck:  Vascular: No carotid bruit or JVD.  Cardiovascular:     Rate and Rhythm: Normal rate and regular rhythm.     Heart sounds: Normal heart sounds. No murmur.  Pulmonary:     Effort: Pulmonary effort is normal.     Breath sounds: Normal breath sounds. No rales.  Skin:    General: Skin is warm and dry.  Neurological:     Mental Status: He is alert and oriented to person, place, and time.        Assessment & Plan:  Jacob Frost is a 64 y.o. male . Essential hypertension - Plan: Lipid panel, Comprehensive metabolic panel  - Stable, tolerating current regimen.  Continue same, labs pending as above.   Hyperlipidemia, unspecified hyperlipidemia type - Plan: Lipid panel  -Tolerating Lipitor, continue same, labs pending  Chronic obstructive pulmonary disease, unspecified COPD type ([place])  -Stable, continue follow-up with pulmonary.  Continue Stiolto, albuterol if needed.  Erectile dysfunction, unspecified erectile dysfunction type  - viagra discussed- use lowest effective dose. Side effects discussed (including but not limited to headache/flushing, blue discoloration of vision, possible vascular steal and risk of cardiac effects if underlying unknown coronary artery disease, and  permanent sensorineural hearing loss). Understanding expressed.  Hyperglycemia - Plan: Hemoglobin A1c BMI 34.0-34.9,adult  -Diet changes, exercise/activity discussed for weight loss.  Prior borderline hyperglycemia, screen for prediabetes/diabetes with A1c.  No orders of the defined types were placed in this encounter.  Patient Instructions   Nice meeting you today.  Return for lab only visit, then 13-month follow-up unless any concerns on your labs.  Please let me know if you need any medication refills during that time.  With changes in diet, increase exercise and work in the garden I expect weight will improve.  Let me know if you have any concerns.  Take care!    If you have lab work done today you will be contacted with your lab results within the next 2 weeks.  If you have not heard from [place] then please contact us. The fastest way to get your results is to register for My Chart.   IF you received an x-ray today, you will receive an invoice from [facility]. Please contact [facility] at [phone] with questions or concerns regarding your invoice.   IF you received labwork today, you will receive an invoice from [facility]. Please contact [facility] at [phone] with questions or concerns regarding your invoice.   Our billing staff will not be able to assist you with questions regarding bills from these companies.  You will be contacted with the lab results as soon as they are available. The fastest way to get your results is to activate your My Chart account. Instructions are located on the last page of this paperwork. If you have not heard from [place] regarding the results in 2 weeks, please contact this office.         Signed, Merri Ray, MD [facility]

## 2019-05-16 ENCOUNTER — Ambulatory Visit: Payer: Medicare HMO

## 2019-05-23 ENCOUNTER — Ambulatory Visit (INDEPENDENT_AMBULATORY_CARE_PROVIDER_SITE_OTHER): Payer: Medicare HMO | Admitting: Family Medicine

## 2019-05-23 ENCOUNTER — Other Ambulatory Visit: Payer: Self-pay

## 2019-05-23 DIAGNOSIS — I10 Essential (primary) hypertension: Secondary | ICD-10-CM

## 2019-05-23 DIAGNOSIS — E785 Hyperlipidemia, unspecified: Secondary | ICD-10-CM

## 2019-05-23 DIAGNOSIS — R739 Hyperglycemia, unspecified: Secondary | ICD-10-CM

## 2019-05-24 LAB — COMPREHENSIVE METABOLIC PANEL
ALT: 57 IU/L — ABNORMAL HIGH (ref 0–44)
AST: 31 IU/L (ref 0–40)
Albumin/Globulin Ratio: 2.1 (ref 1.2–2.2)
Albumin: 4.8 g/dL (ref 3.8–4.8)
Alkaline Phosphatase: 135 IU/L — ABNORMAL HIGH (ref 39–117)
BUN/Creatinine Ratio: 21 (ref 10–24)
BUN: 20 mg/dL (ref 8–27)
Bilirubin Total: 0.4 mg/dL (ref 0.0–1.2)
CO2: 26 mmol/L (ref 20–29)
Calcium: 10.2 mg/dL (ref 8.6–10.2)
Chloride: 102 mmol/L (ref 96–106)
Creatinine, Ser: 0.97 mg/dL (ref 0.76–1.27)
GFR calc Af Amer: 95 mL/min/{1.73_m2} (ref >59)
GFR calc non Af Amer: 82 mL/min/{1.73_m2} (ref >59)
Globulin, Total: 2.3 g/dL (ref 1.5–4.5)
Glucose: 97 mg/dL (ref 65–99)
Potassium: 4.9 mmol/L (ref 3.5–5.2)
Sodium: 140 mmol/L (ref 134–144)
Total Protein: 7.1 g/dL (ref 6.0–8.5)

## 2019-05-24 LAB — LIPID PANEL
Chol/HDL Ratio: 4.6 ratio (ref 0.0–5.0)
Cholesterol, Total: 132 mg/dL (ref 100–199)
HDL: 29 mg/dL — ABNORMAL LOW (ref >39)
LDL Chol Calc (NIH): 79 mg/dL (ref 0–99)
Triglycerides: 135 mg/dL (ref 0–149)
VLDL Cholesterol Cal: 24 mg/dL (ref 5–40)

## 2019-05-24 LAB — HEMOGLOBIN A1C
Est. average glucose Bld gHb Est-mCnc: 120 mg/dL
Hgb A1c MFr Bld: 5.8 % — ABNORMAL HIGH (ref 4.8–5.6)

## 2019-11-02 ENCOUNTER — Encounter: Payer: Medicare HMO | Admitting: Family Medicine

## 2019-11-18 DIAGNOSIS — Z23 Encounter for immunization: Secondary | ICD-10-CM | POA: Diagnosis not present

## 2019-12-27 ENCOUNTER — Telehealth: Payer: Self-pay | Admitting: Emergency Medicine

## 2019-12-27 DIAGNOSIS — Z87891 Personal history of nicotine dependence: Secondary | ICD-10-CM

## 2019-12-27 NOTE — Telephone Encounter (Signed)
Yes, he is part of the lung cancer screening program, needs repeat LDCT in December. Thanks.

## 2019-12-27 NOTE — Telephone Encounter (Signed)
RB pt would like to schedule his yearly CT scan follow up this month.  His last one was 12/2018.  Please advise if we can go ahead and get this scheduled for him.    Last ov--10/20/2018 Next ov--no pending appts.

## 2019-12-28 NOTE — Telephone Encounter (Signed)
Order has been placed for low dose CT scan.  Nothing further is needed

## 2020-01-15 ENCOUNTER — Ambulatory Visit (HOSPITAL_BASED_OUTPATIENT_CLINIC_OR_DEPARTMENT_OTHER)
Admission: RE | Admit: 2020-01-15 | Discharge: 2020-01-15 | Disposition: A | Discharge status: Home / Self Care | Payer: Medicare HMO | Source: Ambulatory Visit | Attending: Emergency Medicine | Admitting: Emergency Medicine

## 2020-01-15 ENCOUNTER — Other Ambulatory Visit: Payer: Self-pay

## 2020-01-15 DIAGNOSIS — Z87891 Personal history of nicotine dependence: Secondary | ICD-10-CM | POA: Insufficient documentation

## 2020-01-29 ENCOUNTER — Telehealth: Payer: Self-pay | Admitting: Emergency Medicine

## 2020-01-29 NOTE — Telephone Encounter (Signed)
Low-dose CT screening showed lung RADS 2 with recommendations for follow-up in 1 year.  Last office visit was September 2020.  Office visit made with Dr. Delton Coombes later this month for follow-up and to review CT in detail. Patient aware and wants to review the last several CT scans.

## 2020-02-13 ENCOUNTER — Telehealth: Payer: Self-pay

## 2020-02-13 ENCOUNTER — Other Ambulatory Visit: Payer: Self-pay | Admitting: Family Medicine

## 2020-02-13 DIAGNOSIS — E785 Hyperlipidemia, unspecified: Secondary | ICD-10-CM

## 2020-02-13 MED ORDER — ATORVASTATIN CALCIUM 40 MG PO TABS: 40.0000 mg | ORAL_TABLET | Freq: Every day | ORAL | 0 refills | Status: DC

## 2020-02-13 NOTE — Telephone Encounter (Signed)
Rx sent to pharmacy   

## 2020-02-13 NOTE — Telephone Encounter (Signed)
Medication Refill - Medication: atorvastatin (LIPITOR) 20 MG tablet  Pt only needs a 30 day supply /pt is currently completely out   Has the patient contacted their pharmacy? yes (Agent: If no, request that the patient contact the pharmacy for the refill.) (Agent: If yes, when and what did the pharmacy advise?)call pcp for refill due to not having [facility] at [facility]   Preferred Pharmacy (with phone number or street name):  [facility] [address] - [place], [place] - [address] [place] [address] Phone:  [phone]  Fax:  [phone]       Agent: Please be advised that RX refills may take up to 3 business days. We ask that you follow-up with your pharmacy.

## 2020-02-13 NOTE — Telephone Encounter (Signed)
..   LAST APPOINTMENT DATE: Visit date not found   NEXT APPOINTMENT DATE:@1 /27/2022 for a New Patient appt   MEDICATION:  Atorvastatin 20mg    PHARMACY: [facility] in [place]   **Let patient know to contact pharmacy at the end of the day to make sure medication is ready. **  ** Please notify patient to allow 48-72 hours to process**  **Encourage patient to contact the pharmacy for refills or they can request refills through [facility]**  CLINICAL FILLS OUT ALL BELOW:   LAST REFILL:  QTY:  REFILL DATE:    OTHER COMMENTS:  Patient would like to know if Dr. Jerline Pain could refill this med before his appt to establish care?  I have advised patient that we have to have an established care appt before prescribing meds.  Patient does not believe that Dr. Carlota Raspberry will be able to refill med.  I have advised patient to reach out to Dr. Vonna Kotyk office as well in regard.  Please follow up with patient in regard to refill.     Okay for refill?  Please advise

## 2020-02-21 ENCOUNTER — Ambulatory Visit: Payer: Medicare HMO | Admitting: Emergency Medicine

## 2020-02-22 ENCOUNTER — Other Ambulatory Visit: Payer: Self-pay

## 2020-02-22 ENCOUNTER — Encounter: Payer: Self-pay | Admitting: Family Medicine

## 2020-02-22 ENCOUNTER — Ambulatory Visit (INDEPENDENT_AMBULATORY_CARE_PROVIDER_SITE_OTHER): Payer: Medicare HMO | Admitting: Family Medicine

## 2020-02-22 VITALS — BP 140/84 | HR 77 | Temp 98.4°F | Ht 69.0 in | Wt 235.4 lb

## 2020-02-22 DIAGNOSIS — Z23 Encounter for immunization: Secondary | ICD-10-CM

## 2020-02-22 DIAGNOSIS — I700 Atherosclerosis of aorta: Secondary | ICD-10-CM | POA: Diagnosis not present

## 2020-02-22 DIAGNOSIS — I10 Essential (primary) hypertension: Secondary | ICD-10-CM | POA: Diagnosis not present

## 2020-02-22 DIAGNOSIS — N529 Male erectile dysfunction, unspecified: Secondary | ICD-10-CM

## 2020-02-22 DIAGNOSIS — G4700 Insomnia, unspecified: Secondary | ICD-10-CM | POA: Diagnosis not present

## 2020-02-22 DIAGNOSIS — E785 Hyperlipidemia, unspecified: Secondary | ICD-10-CM | POA: Diagnosis not present

## 2020-02-22 DIAGNOSIS — J439 Emphysema, unspecified: Secondary | ICD-10-CM | POA: Diagnosis not present

## 2020-02-22 DIAGNOSIS — L409 Psoriasis, unspecified: Secondary | ICD-10-CM | POA: Diagnosis not present

## 2020-02-22 DIAGNOSIS — J449 Chronic obstructive pulmonary disease, unspecified: Secondary | ICD-10-CM

## 2020-02-22 NOTE — Progress Notes (Signed)
Jacob Frost is a 64 y.o. male who presents today for an office visit.  He is transferring care to this office.   Assessment/Plan:  Chronic Problems Addressed Today: ERECTILE DYSFUNCTION, ORGANIC Stable.  Continue Viagra as needed.  Chronic obstructive pulmonary disease (Jacob Frost) Follows with pulmonology.  Symptoms are stable.  Essential hypertension At goal on lisinopril 10 mg daily.  Discussed lifestyle modifications.  Hyperlipidemia On Lipitor 20 mg daily.  Need to check lipids next blood draw.  Psoriasis Stable.  Continue clobetasol as needed.  Insomnia Stable.  Continue Ambien as needed.  Aortic atherosclerosis ([facility]) On statin.  Preventative Healthcare Pneumovax given today.  He will obtain records from most recent colonoscopy.  He will come back in about 6 months for annual physical and we will check labs at that time.     Subjective:  HPI:  See A/p for status Frost chronic conditions.    PMH:  The following were reviewed and entered/updated in epic: Past Medical History:  Diagnosis Date  . Aortic atherosclerosis (Jacob Frost)   . COPD (chronic obstructive pulmonary disease) (Jacob Frost)   . Emphysema Frost lung (Jacob Frost)   . Hyperlipidemia   . Hypertension    Patient Active Problem List   Diagnosis Date Noted  . Insomnia 02/22/2020  . Psoriasis 02/22/2020  . Aortic atherosclerosis (Jacob Frost)   . History Frost smoking 30 or more pack years 10/20/2017  . 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 Hyperlipidemia 04/05/2009  . Essential hypertension 02/08/2009  . Chronic obstructive pulmonary disease (Jacob Frost) 02/08/2009  . OSTEOPOROSIS 02/08/2009  . Jacob Frost 02/08/2009   History reviewed. No pertinent surgical history.  Family History  Problem Relation Age Frost Onset  . Diabetes Father   . Diabetes Sister   . Hypertension Sister     Medications- reviewed and updated Current Outpatient Medications  Medication Sig Dispense Refill  . albuterol (PROAIR HFA) 108 (90  Base) MCG/ACT inhaler Inhale 2 puffs into the lungs every 4 (four) hours as needed for wheezing or shortness Frost breath. 1 Inhaler 3  . atorvastatin (LIPITOR) 20 MG tablet Take 20 mg by mouth daily.    . clobetasol (OLUX) 0.05 % topical foam Apply topically 2 (two) times daily. 100 g 0  . lisinopril (ZESTRIL) 10 MG tablet Take 1 tablet (10 mg total) by mouth daily. 90 tablet 3  . sildenafil (VIAGRA) 100 MG tablet TAKE 1 TABLET BY MOUTH ONCE DAILY AS NEEDED 30 tablet 0  . Tiotropium Bromide-Olodaterol (STIOLTO RESPIMAT) 2.5-2.5 MCG/ACT AERS Inhale 2 puffs into the lungs daily. 2 Inhaler 0  . zolpidem (AMBIEN) 10 MG tablet Take 1 tablet (10 mg total) by mouth at bedtime as needed for sleep. 30 tablet 0   No current facility-administered medications for this visit.    Allergies-reviewed and updated No Known Allergies  Social History   Socioeconomic History  . Marital status: Single    Spouse name: Not on file  . Number Frost children: Not on file  . Years Frost education: Not on file  . Highest education level: Not on file  Occupational History  . Not on file  Tobacco Use  . Smoking status: Former Smoker    Types: Cigarettes    Start date: 07/16/1968    Quit date: 07/16/2005    Years since quitting: 14.6  . Smokeless tobacco: Never Used  Substance and Sexual Activity  . Alcohol use: Never  . Drug use: Never  . Sexual activity: Not on file  Other Topics  Concern  . Not on file  Social History Narrative  . Not on file   Social Determinants Frost Health   Financial Resource Strain: Not on file  Food Insecurity: Not on file  Transportation Needs: Not on file  Physical Activity: Not on file  Stress: Not on file  Social Connections: Not on file          Objective:  Physical Exam: BP 140/84   Pulse 77   Temp 98.4 F (36.9 C) (Temporal)   Ht 5\' 9"  (1.753 m)   Wt 235 lb 6.4 oz (106.8 kg)   SpO2 96%   BMI 34.76 kg/m   Gen: No acute distress, resting comfortably CV: Regular rate  and rhythm with no murmurs appreciated Pulm: Normal work Frost breathing, clear to auscultation bilaterally with no crackles, wheezes, or rhonchi Neuro: Grossly normal, moves all extremities Psych: Normal affect and thought content  Time Spent: 45 minutes Frost total time was spent on the date Frost the encounter performing the following actions: chart review prior to seeing the patient including recent visits with previous PCP and specialists, obtaining history, performing a medically necessary exam, counseling on the treatment plan, placing orders, and documenting in our EHR.        Algis Greenhouse. Jerline Pain, MD 02/22/2020 2:16 PM

## 2020-02-22 NOTE — Patient Instructions (Signed)
It was very nice to see you today!  Keep up the good work!  We will give you your pneumonia vaccine today.  I like to see you back in about 6 months or so for your annual checkup with blood work.  Please back to see me sooner if needed.  Take care, Dr Jerline Pain  Please try these tips to maintain a healthy lifestyle:   Eat at least 3 REAL meals and 1-2 snacks per day.  Aim for no more than 5 hours between eating.  If you eat breakfast, please do so within one hour of getting up.    Each meal should contain half fruits/vegetables, one quarter protein, and one quarter carbs (no bigger than a computer mouse)   Cut down on sweet beverages. This includes juice, soda, and sweet tea.     Drink at least 1 glass of water with each meal and aim for at least 8 glasses per day   Exercise at least 150 minutes every week.

## 2020-02-22 NOTE — Assessment & Plan Note (Signed)
Stable.  Continue Ambien as needed. 

## 2020-02-22 NOTE — Addendum Note (Signed)
Addended by: Betti Cruz on: 02/22/2020 02:35 PM   Modules accepted: Orders

## 2020-02-22 NOTE — Assessment & Plan Note (Signed)
Stable.  Continue Viagra as needed.

## 2020-02-22 NOTE — Assessment & Plan Note (Signed)
On Lipitor 20 mg daily.  Need to check lipids next blood draw.

## 2020-02-22 NOTE — Assessment & Plan Note (Signed)
On statin.

## 2020-02-22 NOTE — Assessment & Plan Note (Signed)
Stable.  Continue clobetasol as needed. 

## 2020-02-22 NOTE — Assessment & Plan Note (Signed)
At goal on lisinopril 10 mg daily.  Discussed lifestyle modifications.

## 2020-02-22 NOTE — Assessment & Plan Note (Signed)
Follows with pulmonology.  Symptoms are stable.

## 2020-02-23 ENCOUNTER — Ambulatory Visit: Payer: Medicare HMO | Admitting: Emergency Medicine

## 2020-02-23 ENCOUNTER — Encounter: Payer: Self-pay | Admitting: Emergency Medicine

## 2020-02-23 DIAGNOSIS — Z87891 Personal history of nicotine dependence: Secondary | ICD-10-CM | POA: Diagnosis not present

## 2020-02-23 DIAGNOSIS — J449 Chronic obstructive pulmonary disease, unspecified: Secondary | ICD-10-CM | POA: Diagnosis not present

## 2020-02-23 NOTE — Assessment & Plan Note (Signed)
No flares, doing well.  He has some increased dyspnea but he relates this to some weight gain (15 pounds).  If persists even after working on his conditioning then we may decide to consider modifying his inhaler regimen.  Note he was only using the Stiolto as needed.  Hopefully adding it back every day will be beneficial.  Please continue your Stiolto 2 puffs once daily.  Try to take this every day on a schedule. Keep your albuterol available use 2 puffs if needed for shortness of breath, chest tightness, wheezing. Use your Mucinex as needed to help with congestion.

## 2020-02-23 NOTE — Assessment & Plan Note (Signed)
Due for his next LDCT December 2022.  Discussed most recent results with him today.

## 2020-02-23 NOTE — Addendum Note (Signed)
Addended by: Dierdre Highman on: 02/23/2020 09:43 AM   Modules accepted: Orders

## 2020-02-23 NOTE — Progress Notes (Signed)
Subjective:    Patient ID: Jacob Frost, male    DOB: 02/05/1955, 65 y.o.   MRN: [id number]  HPI 65 year old former smoker (71 + pack years) with a history of hypertension, hypercholesterolemia.  He carries a diagnosis of COPD that was made at [facility].  He has been given Spiriva but has not taken it, has albuterol which he uses rarely, usually before exercise.  He is referred today to establish care. He has seen Pulmonary at [facility], Dr Greggory Stallion. He has SOB after a city block. He has some intermittent cough, happens after Spiriva especially. Non productive. No CP, does hear some wheeze, random.   LDCT at [facility] 12/2016 > RADS 1 scan, reassuring. He is scheduled for repeat in December.  He has had pneumovax at age 64.  Flu shot   ROV 02/01/18 --follow-up visit for patient with history of tobacco and presumed COPD.  We repeated his pulmonary function testing today and I have reviewed.  This shows moderately severe obstruction with a positive bronchodilator response.  His lung volumes are hyperinflated, diffusion capacity is normal.  He also had a repeat low-dose screening CT scan of the chest done on 01/10/2018 that I reviewed.  This shows small right lung nodules up to 2.9 mm, calcified granuloma in the mid right lung at 1 mm, overall a RADS 2 study.  Plan for repeat in 12 months.   ROV 10/20/2018 --65 year old man with history of COPD, moderately severe obstruction and a positive bronchodilator response on his spirometry.  He also has small pulmonary nodules, calcified granulomata that we are following on low-dose CT scan screening (next in December 2020).  He returns today reporting that he likes the [employer] [facility] - feels that it has been beneficial. Less SOB, no cough or wheeze. No flares since last time. He notes that his exercise tolerance is lower than several years ago. He does have SOB when he walks an extended distance. He has lost about 15lbs since last time. He had a garden this year.  No desat on walk last visit. He needs LDCT in 12/2018.   ROV 02/23/20 --65 year old man with moderately severe COPD and a positive bronchodilator response, calcified granulomata that we have been following with LDCT and lung cancer screening.  Has been managed on Stiolto, tells me that he uses it only prior to exercise.  He also has albuterol which he uses rarely. His breathing has been ok, a bit worse as he has gained some wt since last visit. No cough, occasionally hears wheeze at night when he lays down at night. No flares, has not needed abx or pred.   LDCT done 01/15/2020 reviewed by me, shows no mediastinal or hilar adenopathy, moderate bullous type emphysema, some dependent right mainstem secretions, tiny calcified and noncalcified pulmonary nodules they are overall unchanged, largest 3 mm.  This was a lung RADS 2 study, recommended repeat in 12 months (December 2022)   Review of Systems  Constitutional: Negative for fever and unexpected weight change.  HENT: Negative for congestion, dental problem, ear pain, nosebleeds, postnasal drip, rhinorrhea, sinus pressure, sneezing, sore throat and trouble swallowing.   Eyes: Negative for redness and itching.  Respiratory: Positive for shortness of breath and wheezing. Negative for cough and chest tightness.   Cardiovascular: Negative for palpitations and leg swelling.  Gastrointestinal: Negative for nausea and vomiting.  Genitourinary: Negative for dysuria.  Musculoskeletal: Negative for joint swelling.  Skin: Negative for rash.  Neurological: Negative for headaches.  Hematological: Does not bruise/bleed  easily.  Psychiatric/Behavioral: Negative for dysphoric mood. The patient is not nervous/anxious.         Objective:   Physical Exam  Vitals:   02/23/20 0912  BP: 138/78  Pulse: 69  Temp: (!) 97 F (36.1 C)  SpO2: 97%  Weight: 234 lb 9.6 oz (106.4 kg)  Height: 5\' 9"  (1.753 m)   Gen: Pleasant, overwt man, in no distress,  normal  affect  ENT: No lesions,  mouth clear,  oropharynx clear, no postnasal drip  Neck: No JVD, no stridor  Lungs: No use of accessory muscles, somewhat distant.  No wheezing on a normal respiratory cycle or on a forced expiration  Cardiovascular: RRR, heart sounds normal, no murmur or gallops, no peripheral edema  Musculoskeletal: No deformities, no cyanosis or clubbing  Neuro: alert, non focal  Skin: Warm, no lesions or rash      Assessment & Plan:  Chronic obstructive pulmonary disease (HCC) No flares, doing well.  He has some increased dyspnea but he relates this to some weight gain (15 pounds).  If persists even after working on his conditioning then we may decide to consider modifying his inhaler regimen.  Note he was only using the Stiolto as needed.  Hopefully adding it back every day will be beneficial.  Please continue your Stiolto 2 puffs once daily.  Try to take this every day on a schedule. Keep your albuterol available use 2 puffs if needed for shortness of breath, chest tightness, wheezing. Use your Mucinex as needed to help with congestion.   History of smoking 30 or more pack years Due for his next LDCT December 2022.  Discussed most recent results with him today.  Baltazar Apo, MD, PhD 02/23/2020, 9:40 AM [facility] [phone] or if no answer [phone]

## 2020-02-23 NOTE — Patient Instructions (Signed)
Please continue your Stiolto 2 puffs once daily.  Try to take this every day on a schedule. Keep your albuterol available use 2 puffs if needed for shortness of breath, chest tightness, wheezing. Use your Mucinex as needed to help with congestion. We will plan to repeat your low-dose CT scan of the chest in December 2022. Follow with Dr. Lamonte Sakai in December after your CT scan so that we can review the results together, sooner if you have any problems.

## 2020-04-15 ENCOUNTER — Other Ambulatory Visit: Payer: Self-pay | Admitting: *Deleted

## 2020-04-15 MED ORDER — ATORVASTATIN CALCIUM 20 MG PO TABS
20.0000 mg | ORAL_TABLET | Freq: Every day | ORAL | 3 refills | Status: DC
Start: 2020-04-15 — End: 2021-02-10

## 2020-04-15 NOTE — Telephone Encounter (Signed)
Requesting refills Rx Atorvastatin 20mg  last refilled by historical provider

## 2020-05-10 IMAGING — CT CT CHEST LUNG CANCER SCREENING LOW DOSE W/O CM
2 of 5 series · 15 of 40 positions shown, 18 images · non-contrast
Comparison: Low-dose lung cancer screening CT chest dated
01/08/2017

CLINICAL DATA: 62-year-old male former smoker, quit 12 years ago,
with 70 pack-year history of smoking, for follow-up lung cancer
screening

EXAM:
CT CHEST WITHOUT CONTRAST LOW-DOSE FOR LUNG CANCER SCREENING
TECHNIQUE: Multidetector CT imaging of the chest was performed following the
standard protocol without IV contrast.

[Series 3: lungs · axial · 0.77mm/px · z∈[-391,-90]mm · 12 of 333 slices shown, 15 images]
[im 16/333  mediastinal]
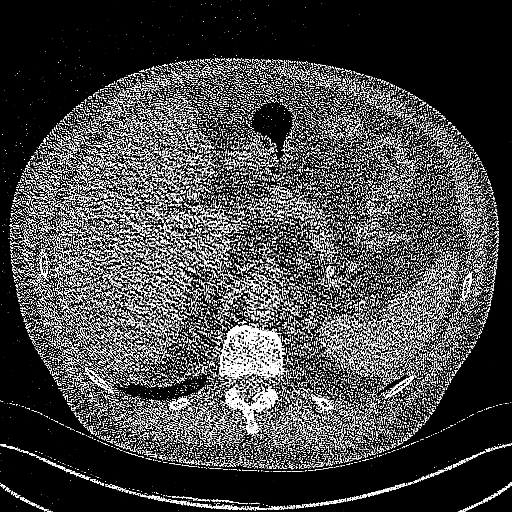
[im 16/333  lung]
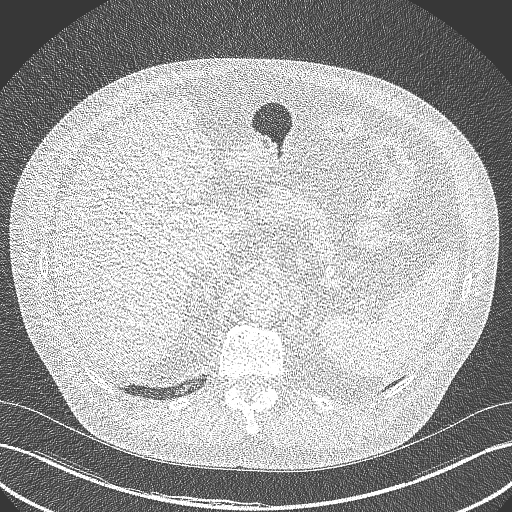
[im 46/333  lung]
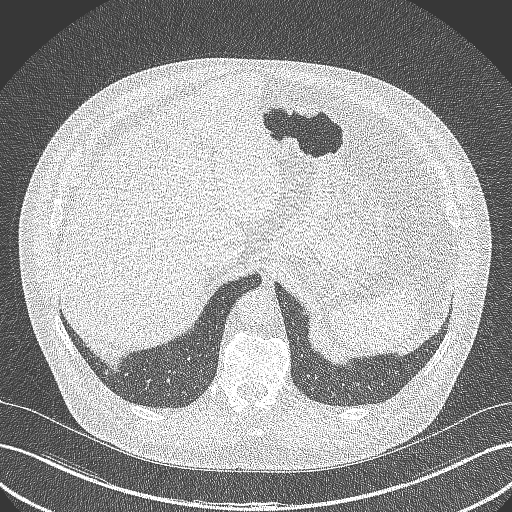
[im 76/333  lung]
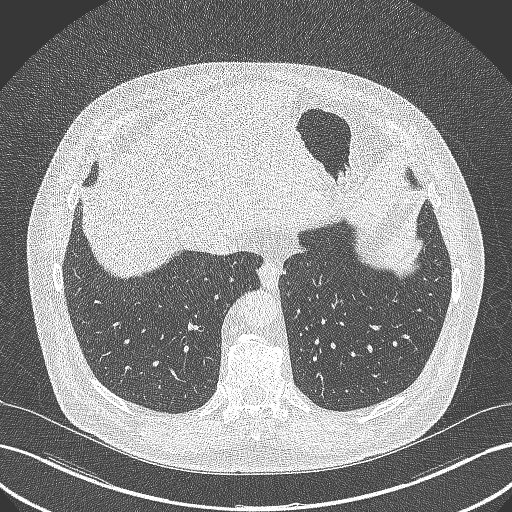
[im 106/333  lung]
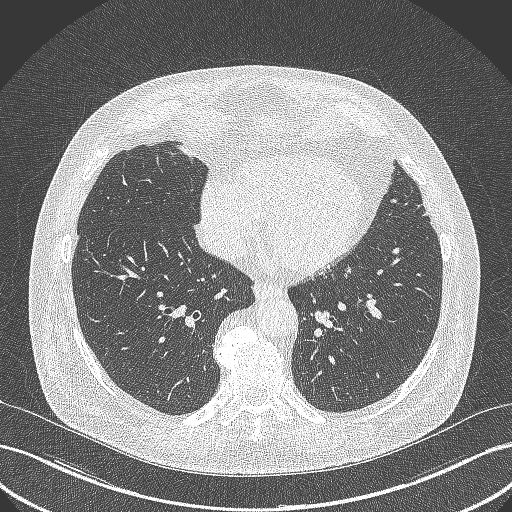
[im 121/333  mediastinal]
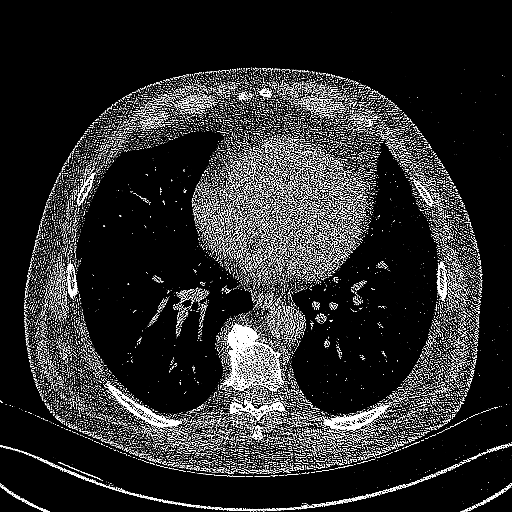
[im 121/333  lung]
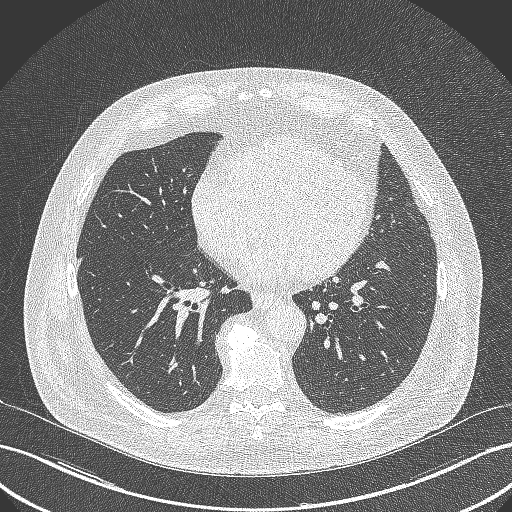
[im 151/333  lung]
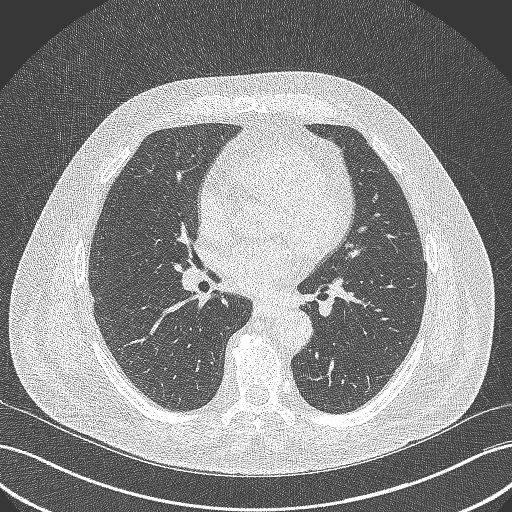
[im 182/333  lung]
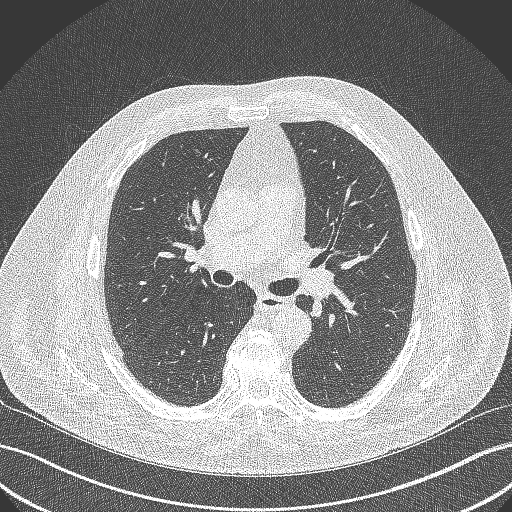
[im 212/333  lung]
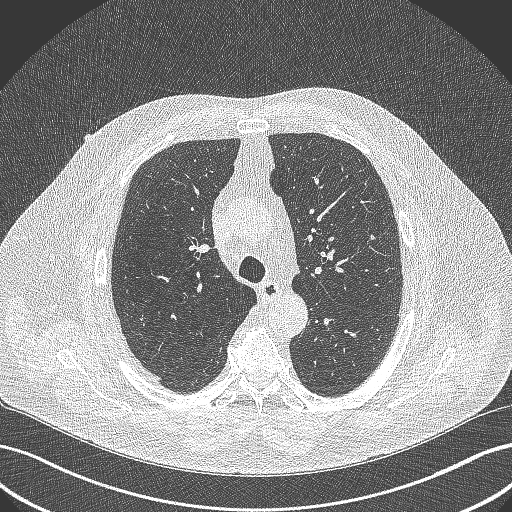
[im 227/333  mediastinal]
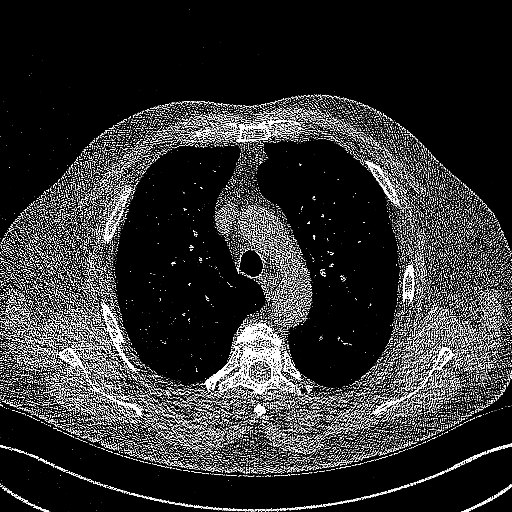
[im 227/333  lung]
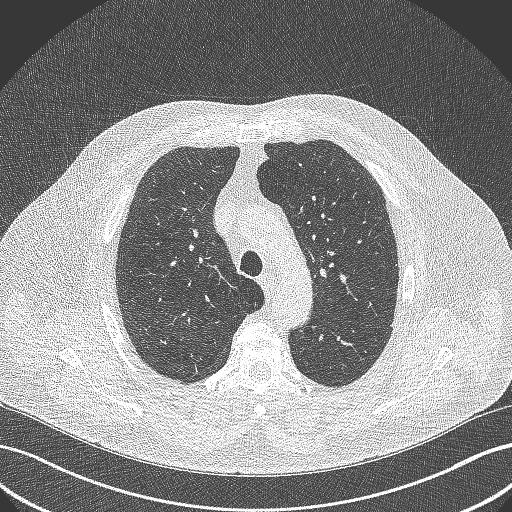
[im 257/333  lung]
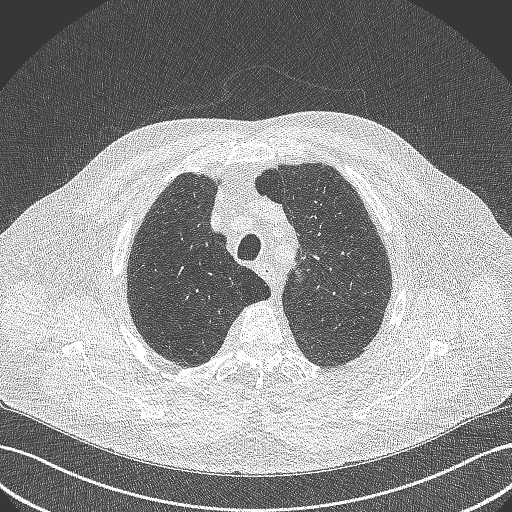
[im 287/333  lung]
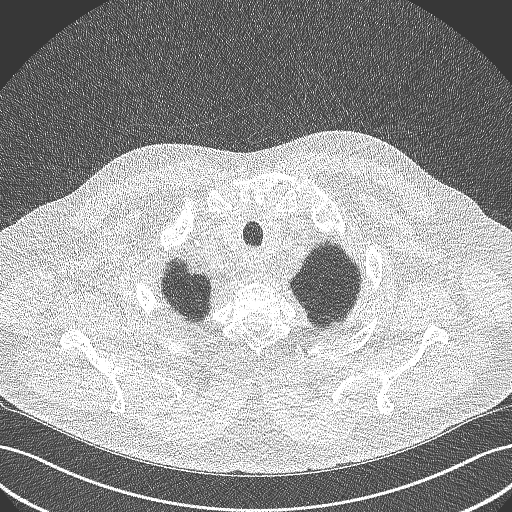
[im 317/333  lung]
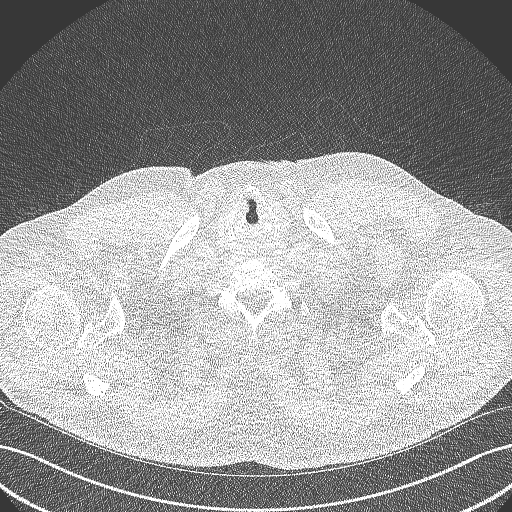

[Series 5: coronal · coronal · 0.66mm/px · 3 of 313 slices shown]
[im 63/313  lung]
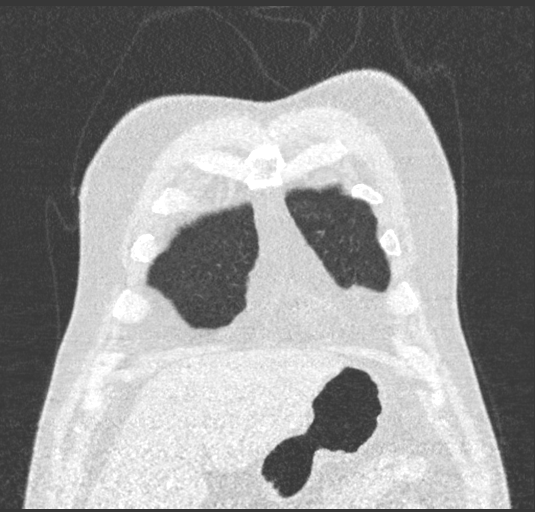
[im 125/313  lung]
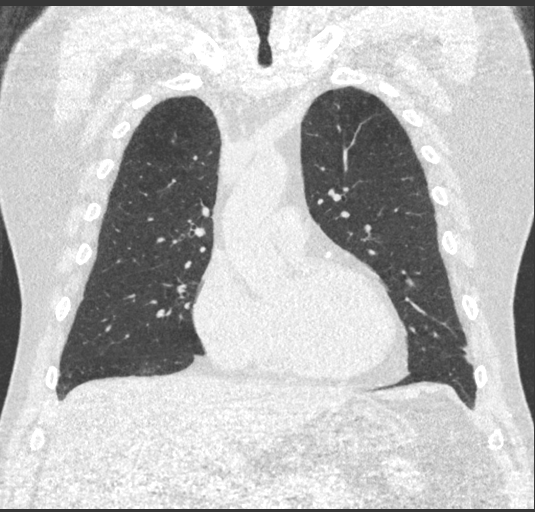
[im 188/313  lung]
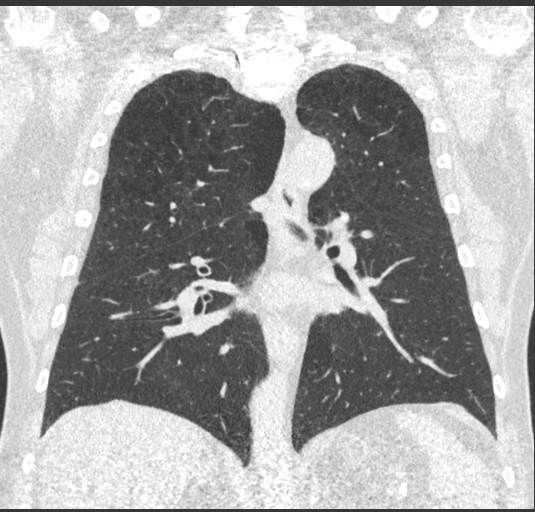

[15 of 40 positions shown; findings below may reference images not displayed]

FINDINGS: Cardiovascular: Heart is normal in size.  No pericardial effusion.

No evidence of thoracic aortic aneurysm. Mild atherosclerotic
calcifications of the aortic arch.

Coronary atherosclerosis the LAD and left circumflex.

Mediastinum/Nodes: No suspicious mediastinal lymphadenopathy.

Visualized thyroid is unremarkable.

Lungs/Pleura: Moderate centrilobular and paraseptal emphysematous
changes, upper lobe predominant.

No focal consolidation.

Small right lung nodules measuring up to 2.9 mm. Calcified granuloma
at the medial right lung apex measuring 1.0 mm, benign.

No pleural effusion or pneumothorax.

Upper Abdomen: Visualized upper abdomen is notable for moderate
hepatic steatosis.

Musculoskeletal: Degenerative changes of the visualized
thoracolumbar spine. Mild superior endplate changes at T8,
unchanged.
IMPRESSION: Lung-RADS 2, benign appearance or behavior. Continue annual
screening with low-dose chest CT without contrast in 12 months.

Aortic Atherosclerosis (G17IY-SRO.O) and Emphysema (G17IY-B0Z.9).

## 2020-05-14 ENCOUNTER — Telehealth: Payer: Self-pay

## 2020-05-14 ENCOUNTER — Other Ambulatory Visit: Payer: Self-pay

## 2020-05-14 DIAGNOSIS — Z1211 Encounter for screening for malignant neoplasm of colon: Secondary | ICD-10-CM

## 2020-05-14 NOTE — Telephone Encounter (Signed)
Pt is requesting a referral, so he can get a colonoscopy performed.

## 2020-05-14 NOTE — Telephone Encounter (Signed)
Order placed

## 2020-07-07 DIAGNOSIS — J410 Simple chronic bronchitis: Secondary | ICD-10-CM | POA: Diagnosis not present

## 2020-07-07 DIAGNOSIS — Z20822 Contact with and (suspected) exposure to covid-19: Secondary | ICD-10-CM | POA: Diagnosis not present

## 2020-07-07 DIAGNOSIS — R0789 Other chest pain: Secondary | ICD-10-CM | POA: Diagnosis not present

## 2020-07-07 DIAGNOSIS — R569 Unspecified convulsions: Secondary | ICD-10-CM | POA: Diagnosis not present

## 2020-07-07 DIAGNOSIS — R4182 Altered mental status, unspecified: Secondary | ICD-10-CM | POA: Diagnosis not present

## 2020-07-07 DIAGNOSIS — R0602 Shortness of breath: Secondary | ICD-10-CM | POA: Diagnosis not present

## 2020-07-07 DIAGNOSIS — R7989 Other specified abnormal findings of blood chemistry: Secondary | ICD-10-CM | POA: Diagnosis not present

## 2020-07-07 DIAGNOSIS — R5383 Other fatigue: Secondary | ICD-10-CM | POA: Diagnosis not present

## 2020-07-07 DIAGNOSIS — I959 Hypotension, unspecified: Secondary | ICD-10-CM | POA: Diagnosis not present

## 2020-07-07 DIAGNOSIS — I2510 Atherosclerotic heart disease of native coronary artery without angina pectoris: Secondary | ICD-10-CM | POA: Diagnosis not present

## 2020-07-07 DIAGNOSIS — E872 Acidosis: Secondary | ICD-10-CM | POA: Diagnosis not present

## 2020-07-07 DIAGNOSIS — Z87891 Personal history of nicotine dependence: Secondary | ICD-10-CM | POA: Diagnosis not present

## 2020-07-07 DIAGNOSIS — R61 Generalized hyperhidrosis: Secondary | ICD-10-CM | POA: Diagnosis not present

## 2020-07-07 DIAGNOSIS — R079 Chest pain, unspecified: Secondary | ICD-10-CM | POA: Diagnosis not present

## 2020-07-07 DIAGNOSIS — R9439 Abnormal result of other cardiovascular function study: Secondary | ICD-10-CM | POA: Diagnosis not present

## 2020-07-07 DIAGNOSIS — D3500 Benign neoplasm of unspecified adrenal gland: Secondary | ICD-10-CM | POA: Diagnosis not present

## 2020-07-07 DIAGNOSIS — J449 Chronic obstructive pulmonary disease, unspecified: Secondary | ICD-10-CM | POA: Diagnosis not present

## 2020-07-07 DIAGNOSIS — R001 Bradycardia, unspecified: Secondary | ICD-10-CM | POA: Diagnosis not present

## 2020-07-07 DIAGNOSIS — R000 Tachycardia, unspecified: Secondary | ICD-10-CM | POA: Diagnosis not present

## 2020-07-07 DIAGNOSIS — I10 Essential (primary) hypertension: Secondary | ICD-10-CM | POA: Diagnosis not present

## 2020-07-07 DIAGNOSIS — R42 Dizziness and giddiness: Secondary | ICD-10-CM | POA: Diagnosis not present

## 2020-07-08 DIAGNOSIS — J449 Chronic obstructive pulmonary disease, unspecified: Secondary | ICD-10-CM | POA: Diagnosis not present

## 2020-07-08 DIAGNOSIS — I10 Essential (primary) hypertension: Secondary | ICD-10-CM | POA: Diagnosis not present

## 2020-07-08 DIAGNOSIS — R0789 Other chest pain: Secondary | ICD-10-CM | POA: Diagnosis not present

## 2020-07-08 DIAGNOSIS — R7989 Other specified abnormal findings of blood chemistry: Secondary | ICD-10-CM | POA: Diagnosis not present

## 2020-07-08 DIAGNOSIS — Z79899 Other long term (current) drug therapy: Secondary | ICD-10-CM | POA: Diagnosis not present

## 2020-07-08 DIAGNOSIS — Z87891 Personal history of nicotine dependence: Secondary | ICD-10-CM | POA: Diagnosis not present

## 2020-07-08 DIAGNOSIS — R000 Tachycardia, unspecified: Secondary | ICD-10-CM | POA: Diagnosis not present

## 2020-07-08 DIAGNOSIS — R079 Chest pain, unspecified: Secondary | ICD-10-CM | POA: Diagnosis not present

## 2020-07-08 DIAGNOSIS — R42 Dizziness and giddiness: Secondary | ICD-10-CM | POA: Diagnosis not present

## 2020-07-08 DIAGNOSIS — E785 Hyperlipidemia, unspecified: Secondary | ICD-10-CM | POA: Diagnosis not present

## 2020-07-09 DIAGNOSIS — I10 Essential (primary) hypertension: Secondary | ICD-10-CM | POA: Diagnosis not present

## 2020-07-09 DIAGNOSIS — J40 Bronchitis, not specified as acute or chronic: Secondary | ICD-10-CM | POA: Diagnosis not present

## 2020-07-09 DIAGNOSIS — R0789 Other chest pain: Secondary | ICD-10-CM | POA: Diagnosis not present

## 2020-07-09 DIAGNOSIS — J449 Chronic obstructive pulmonary disease, unspecified: Secondary | ICD-10-CM | POA: Diagnosis not present

## 2020-07-09 DIAGNOSIS — I2510 Atherosclerotic heart disease of native coronary artery without angina pectoris: Secondary | ICD-10-CM | POA: Diagnosis not present

## 2020-07-09 DIAGNOSIS — R079 Chest pain, unspecified: Secondary | ICD-10-CM | POA: Diagnosis not present

## 2020-07-22 ENCOUNTER — Telehealth: Payer: Self-pay

## 2020-07-22 NOTE — Telephone Encounter (Signed)
Please see message below

## 2020-07-22 NOTE — Telephone Encounter (Signed)
Patient came by the office and brought his BP readings for the last week paper was placed in folder. Patient was in the ED on 07/07/20 and they took him of his BP medication. Patient wants to know if he should start it back. Made patient appt for 08/01/20 to discuss BP.

## 2020-07-23 NOTE — Telephone Encounter (Signed)
I have not seen his BP log. Is it still up front?  Jacob Frost. Jerline Pain, MD 07/23/2020 8:49 AM

## 2020-07-24 NOTE — Telephone Encounter (Signed)
Dr Jerline Pain seen BP log Patient  Doing good  Form placed to be scan

## 2020-08-01 ENCOUNTER — Ambulatory Visit (INDEPENDENT_AMBULATORY_CARE_PROVIDER_SITE_OTHER): Payer: Medicare HMO | Admitting: Family Medicine

## 2020-08-01 ENCOUNTER — Other Ambulatory Visit: Payer: Self-pay

## 2020-08-01 ENCOUNTER — Encounter: Payer: Self-pay | Admitting: Family Medicine

## 2020-08-01 VITALS — BP 120/78 | HR 93 | Temp 98.1°F | Ht 69.0 in | Wt 224.0 lb

## 2020-08-01 DIAGNOSIS — I10 Essential (primary) hypertension: Secondary | ICD-10-CM

## 2020-08-01 DIAGNOSIS — E785 Hyperlipidemia, unspecified: Secondary | ICD-10-CM

## 2020-08-01 DIAGNOSIS — R739 Hyperglycemia, unspecified: Secondary | ICD-10-CM

## 2020-08-01 LAB — COMPREHENSIVE METABOLIC PANEL
ALT: 63 U/L — ABNORMAL HIGH (ref 0–53)
AST: 33 U/L (ref 0–37)
Albumin: 4.5 g/dL (ref 3.5–5.2)
Alkaline Phosphatase: 136 U/L — ABNORMAL HIGH (ref 39–117)
BUN: 12 mg/dL (ref 6–23)
CO2: 25 mEq/L (ref 19–32)
Calcium: 10.1 mg/dL (ref 8.4–10.5)
Chloride: 106 mEq/L (ref 96–112)
Creatinine, Ser: 0.81 mg/dL (ref 0.40–1.50)
GFR: 92.3 mL/min (ref >60.00)
Glucose, Bld: 87 mg/dL (ref 70–99)
Potassium: 4 mEq/L (ref 3.5–5.1)
Sodium: 140 mEq/L (ref 135–145)
Total Bilirubin: 0.7 mg/dL (ref 0.2–1.2)
Total Protein: 7.4 g/dL (ref 6.0–8.3)

## 2020-08-01 LAB — LIPID PANEL
Cholesterol: 148 mg/dL (ref 0–200)
HDL: 32 mg/dL — ABNORMAL LOW (ref >39.00)
LDL Cholesterol: 84 mg/dL (ref 0–99)
NonHDL: 116.34
Total CHOL/HDL Ratio: 5
Triglycerides: 161 mg/dL — ABNORMAL HIGH (ref 0.0–149.0)
VLDL: 32.2 mg/dL (ref 0.0–40.0)

## 2020-08-01 LAB — TSH: TSH: 0.77 u[IU]/mL (ref 0.35–5.50)

## 2020-08-01 LAB — CBC
HCT: 46.2 % (ref 39.0–52.0)
Hemoglobin: 15.8 g/dL (ref 13.0–17.0)
MCHC: 34.2 g/dL (ref 30.0–36.0)
MCV: 88.6 fl (ref 78.0–100.0)
Platelets: 223 10*3/uL (ref 150.0–400.0)
RBC: 5.22 Mil/uL (ref 4.22–5.81)
RDW: 13.8 % (ref 11.5–15.5)
WBC: 9.2 10*3/uL (ref 4.0–10.5)

## 2020-08-01 LAB — HEMOGLOBIN A1C: Hgb A1c MFr Bld: 6 % (ref 4.6–6.5)

## 2020-08-01 MED ORDER — LISINOPRIL 5 MG PO TABS: 10.0000 mg | ORAL_TABLET | Freq: Every day | ORAL | 0 refills | Status: DC

## 2020-08-01 NOTE — Patient Instructions (Signed)
It was very nice to see you today!  We will decrease your lisinopril to 5 mg daily.  Please keep an eye on your blood pressures and let me know how they are looking over the next couple weeks.  We will check blood work today.  I will see back in 6 to 12 months.  Take care, Dr Jerline Pain  PLEASE NOTE:  If you had any lab tests please let us know if you have not heard back within a few days. You may see your results on mychart before we have a chance to review them but we will give you a call once they are reviewed by [place]. If we ordered any referrals today, please let us know if you have not heard from their office within the next week.   Please try these tips to maintain a healthy lifestyle:  Eat at least 3 REAL meals and 1-2 snacks per day.  Aim for no more than 5 hours between eating.  If you eat breakfast, please do so within one hour of getting up.   Each meal should contain half fruits/vegetables, one quarter protein, and one quarter carbs (no bigger than a computer mouse)  Cut down on sweet beverages. This includes juice, soda, and sweet tea.   Drink at least 1 glass of water with each meal and aim for at least 8 glasses per day  Exercise at least 150 minutes every week.

## 2020-08-01 NOTE — Assessment & Plan Note (Signed)
Normotensive today.  Given his recent hypotensive episode we will decrease his lisinopril down to 5 mg daily.  He will continue home monitoring and check in with [place] in a couple of weeks via MyChart.  We will check labs today.

## 2020-08-01 NOTE — Progress Notes (Signed)
   Jacob Frost is a 65 y.o. male who presents today for an office visit.  Assessment/Plan:  New/Acute Problems: Chest pain Now resolved.  No recurrent issues.  Underwent complete cardiac work-up during hospitalization with no significant obstruction on catheterization.  Likely due to hypertensive episode.  Chronic Problems Addressed Today: Essential hypertension Normotensive today.  Given his recent hypotensive episode we will decrease his lisinopril down to 5 mg daily.  He will continue home monitoring and check in with [place] in a couple of weeks via MyChart.  We will check labs today.  Hyperlipidemia On Lipitor 20 mg daily.  We will check labs today.  Depending on results may consider increasing intensity of statin to 40 mg daily given his mild CAD found on catheterization during recent hospitalization.     Subjective:  HPI:  Patient here for follow-up.  He was last seen about 6 months ago.  Unfortunately about 3 weeks ago was admitted to an outside hospital with chest pain and hypotension.  He presented to the ED on 07/07/2020 via EMS with chest pain.  In the ED had cardiac work-up which was negative.  Was admitted for ACS rule out.  Underwent exercise stress test which was positive.  Subsequently underwent cardiac catheterization which showed mild nonobstructive coronary artery disease.  Started on aspirin and discharged home.  He was initially hypertensive on admission.  This thought to be possibly vasovagal in etiology.  He responded well to IV fluid bolus.  He was discharged home on 07/09/2020.  Since being home he has done well.  He did have 1 episode of elevated blood pressure readings into the 140s and restarted lisinopril 10 mg daily.  His home blood pressure readings have been predominantly in the 100s to 120s since restarting.  No recurrent episodes of chest pain.       Objective:  Physical Exam: BP 120/78   Pulse 93   Temp 98.1 F (36.7 C) (Temporal)   Ht 5\' 9"  (1.753 m)    Wt 224 lb (101.6 kg)   SpO2 96%   BMI 33.08 kg/m   Gen: No acute distress, resting comfortably CV: Regular rate and rhythm with no murmurs appreciated Pulm: Normal work of breathing, clear to auscultation bilaterally with no crackles, wheezes, or rhonchi Neuro: Grossly normal, moves all extremities Psych: Normal affect and thought content  Time Spent: 45 minutes of total time was spent on the date of the encounter performing the following actions: chart review prior to seeing the patient, obtaining history, performing a medically necessary exam, counseling on the treatment plan, placing orders, and documenting in our EHR.        Algis Greenhouse. Jerline Pain, MD 08/01/2020 11:51 AM

## 2020-08-01 NOTE — Assessment & Plan Note (Signed)
On Lipitor 20 mg daily.  We will check labs today.  Depending on results may consider increasing intensity of statin to 40 mg daily given his mild CAD found on catheterization during recent hospitalization.

## 2020-08-02 NOTE — Progress Notes (Signed)
Please inform patient of the following:  Is LDL is 84.  We should ideally get his LDL less than 70 for optimal protection against heart attack and stroke.  Recommend increasing his Lipitor to 40 mg daily and we can recheck this in 6 to 12 months.  His A1c is borderline.  He should continue to work on diet and exercise and we can recheck this in year.  His liver numbers are little bit elevated.  He recently had a CT scan done which showed fatty liver.  CT scan is not good at ruling out gallstones.  I would like to check a right upper quadrant ultrasound to further evaluate.  It is possible that gallstones could have caused his episode last month resulting in hospitalization as well.  Everything else is stable.

## 2020-08-05 ENCOUNTER — Other Ambulatory Visit: Payer: Self-pay

## 2020-08-05 DIAGNOSIS — R1011 Right upper quadrant pain: Secondary | ICD-10-CM

## 2020-08-05 DIAGNOSIS — K8020 Calculus of gallbladder without cholecystitis without obstruction: Secondary | ICD-10-CM

## 2020-08-14 ENCOUNTER — Encounter: Payer: Self-pay | Admitting: Family Medicine

## 2020-08-22 ENCOUNTER — Encounter: Payer: Medicare HMO | Admitting: Family Medicine

## 2020-08-26 ENCOUNTER — Telehealth: Payer: Self-pay | Admitting: *Deleted

## 2020-08-26 NOTE — Telephone Encounter (Signed)
Pharmacy [facility] requesting Refills Rx Zolpidem Last ov 08/01/2020 Last refills 03/09/2019 Quantity #30  Ref 0 Done by Raynelle Fanning

## 2020-08-27 MED ORDER — ZOLPIDEM TARTRATE 10 MG PO TABS: 10.0000 mg | ORAL_TABLET | Freq: Every evening | ORAL | 0 refills | Status: DC | PRN

## 2020-08-30 ENCOUNTER — Other Ambulatory Visit: Payer: Self-pay

## 2020-08-30 NOTE — Telephone Encounter (Signed)
Patient called regarding his prescription for Zolpidem. He stated that the [place] is requesting the correct dosage before they can fill it. He would like to speak to a nurse. He is unsure why the pharmacy is having the issues

## 2020-08-30 NOTE — Telephone Encounter (Signed)
Spoke to pt he said he is having trouble with the new [place] in regards to his Zolpidem Rx. Pt said he told them he will not be using them anymore and cancelled Rx. Pt said he needs Rx sent to [facility]. Told pt okay, Dr. Jerline Pain is out of the office till Monday and then it will be sent. Pt verbalized understanding.

## 2020-08-31 MED ORDER — ZOLPIDEM TARTRATE 10 MG PO TABS: 10.0000 mg | ORAL_TABLET | Freq: Every evening | ORAL | 2 refills | Status: DC | PRN

## 2020-09-06 ENCOUNTER — Ambulatory Visit (INDEPENDENT_AMBULATORY_CARE_PROVIDER_SITE_OTHER): Payer: Medicare HMO

## 2020-09-06 DIAGNOSIS — Z0000 Encounter for general adult medical examination without abnormal findings: Secondary | ICD-10-CM | POA: Diagnosis not present

## 2020-09-06 NOTE — Patient Instructions (Signed)
Mr. Jacob Frost , Thank you for taking time to come for your Medicare Wellness Visit. I appreciate your ongoing commitment to your health goals. Please review the following plan we discussed and let me know if I can assist you in the future.   Screening recommendations/referrals: Colonoscopy: pt will follow up with previous provider  Recommended yearly ophthalmology/optometry visit for glaucoma screening and checkup Recommended yearly dental visit for hygiene and checkup  Vaccinations: Influenza vaccine: Due Pneumococcal vaccine: Completed  Tdap vaccine: Due  Shingles vaccine: Shingrix discussed. Please contact your pharmacy for coverage information.    Covid-19: Completed 3/6 & 04/21/19  Advanced directives: Advance directive discussed with you today. Even though you declined this today please call our office should you change your mind and we can give you the proper paperwork for you to fill out.  Conditions/risks identified: None at this time   Next appointment: Follow up in one year for your annual wellness visit.   Preventive Care 1 Years and Older, Male Preventive care refers to lifestyle choices and visits with your health care provider that can promote health and wellness. What does preventive care include? A yearly physical exam. This is also called an annual well check. Dental exams once or twice a year. Routine eye exams. Ask your health care provider how often you should have your eyes checked. Personal lifestyle choices, including: Daily care of your teeth and gums. Regular physical activity. Eating a healthy diet. Avoiding tobacco and drug use. Limiting alcohol use. Practicing safe sex. Taking low doses of aspirin every day. Taking vitamin and mineral supplements as recommended by your health care provider. What happens during an annual well check? The services and screenings done by your health care provider during your annual well check will depend on your age, overall  health, lifestyle risk factors, and family history of disease. Counseling  Your health care provider may ask you questions about your: Alcohol use. Tobacco use. Drug use. Emotional well-being. Home and relationship well-being. Sexual activity. Eating habits. History of falls. Memory and ability to understand (cognition). Work and work Statistician. Screening  You may have the following tests or measurements: Height, weight, and BMI. Blood pressure. Lipid and cholesterol levels. These may be checked every 5 years, or more frequently if you are over 41 years old. Skin check. Lung cancer screening. You may have this screening every year starting at age 3 if you have a 30-pack-year history of smoking and currently smoke or have quit within the past 15 years. Fecal occult blood test (FOBT) of the stool. You may have this test every year starting at age 45. Flexible sigmoidoscopy or colonoscopy. You may have a sigmoidoscopy every 5 years or a colonoscopy every 10 years starting at age 75. Prostate cancer screening. Recommendations will vary depending on your family history and other risks. Hepatitis C blood test. Hepatitis B blood test. Sexually transmitted disease (STD) testing. Diabetes screening. This is done by checking your blood sugar (glucose) after you have not eaten for a while (fasting). You may have this done every 1-3 years. Abdominal aortic aneurysm (AAA) screening. You may need this if you are a current or former smoker. Osteoporosis. You may be screened starting at age 84 if you are at high risk. Talk with your health care provider about your test results, treatment options, and if necessary, the need for more tests. Vaccines  Your health care provider may recommend certain vaccines, such as: Influenza vaccine. This is recommended every year. Tetanus, diphtheria, and  acellular pertussis (Tdap, Td) vaccine. You may need a Td booster every 10 years. Zoster vaccine. You may  need this after age 51. Pneumococcal 13-valent conjugate (PCV13) vaccine. One dose is recommended after age 65. Pneumococcal polysaccharide (PPSV23) vaccine. One dose is recommended after age 72. Talk to your health care provider about which screenings and vaccines you need and how often you need them. This information is not intended to replace advice given to you by your health care provider. Make sure you discuss any questions you have with your health care provider. Document Released: 02/08/2015 Document Revised: 10/02/2015 Document Reviewed: 11/13/2014 Elsevier Interactive Patient Education  2017 [place] Prevention in the Home Falls can cause injuries. They can happen to people of all ages. There are many things you can do to make your home safe and to help prevent falls. What can I do on the outside of my home? Regularly fix the edges of walkways and driveways and fix any cracks. Remove anything that might make you trip as you walk through a door, such as a raised step or threshold. Trim any bushes or trees on the path to your home. Use bright outdoor lighting. Clear any walking paths of anything that might make someone trip, such as rocks or tools. Regularly check to see if handrails are loose or broken. Make sure that both sides of any steps have handrails. Any raised decks and porches should have guardrails on the edges. Have any leaves, snow, or ice cleared regularly. Use sand or salt on walking paths during winter. Clean up any spills in your garage right away. This includes oil or grease spills. What can I do in the bathroom? Use night lights. Install grab bars by the toilet and in the tub and shower. Do not use towel bars as grab bars. Use non-skid mats or decals in the tub or shower. If you need to sit down in the shower, use a plastic, non-slip stool. Keep the floor dry. Clean up any water that spills on the floor as soon as it happens. Remove soap buildup in  the tub or shower regularly. Attach bath mats securely with double-sided non-slip rug tape. Do not have throw rugs and other things on the floor that can make you trip. What can I do in the bedroom? Use night lights. Make sure that you have a light by your bed that is easy to reach. Do not use any sheets or blankets that are too big for your bed. They should not hang down onto the floor. Have a firm chair that has side arms. You can use this for support while you get dressed. Do not have throw rugs and other things on the floor that can make you trip. What can I do in the kitchen? Clean up any spills right away. Avoid walking on wet floors. Keep items that you use a lot in easy-to-reach places. If you need to reach something above you, use a strong step stool that has a grab bar. Keep electrical cords out of the way. Do not use floor polish or wax that makes floors slippery. If you must use wax, use non-skid floor wax. Do not have throw rugs and other things on the floor that can make you trip. What can I do with my stairs? Do not leave any items on the stairs. Make sure that there are handrails on both sides of the stairs and use them. Fix handrails that are broken or loose. Make sure that  handrails are as long as the stairways. Check any carpeting to make sure that it is firmly attached to the stairs. Fix any carpet that is loose or worn. Avoid having throw rugs at the top or bottom of the stairs. If you do have throw rugs, attach them to the floor with carpet tape. Make sure that you have a light switch at the top of the stairs and the bottom of the stairs. If you do not have them, ask someone to add them for you. What else can I do to help prevent falls? Wear shoes that: Do not have high heels. Have rubber bottoms. Are comfortable and fit you well. Are closed at the toe. Do not wear sandals. If you use a stepladder: Make sure that it is fully opened. Do not climb a closed  stepladder. Make sure that both sides of the stepladder are locked into place. Ask someone to hold it for you, if possible. Clearly mark and make sure that you can see: Any grab bars or handrails. First and last steps. Where the edge of each step is. Use tools that help you move around (mobility aids) if they are needed. These include: Canes. Walkers. Scooters. Crutches. Turn on the lights when you go into a dark area. Replace any light bulbs as soon as they burn out. Set up your furniture so you have a clear path. Avoid moving your furniture around. If any of your floors are uneven, fix them. If there are any pets around you, be aware of where they are. Review your medicines with your doctor. Some medicines can make you feel dizzy. This can increase your chance of falling. Ask your doctor what other things that you can do to help prevent falls. This information is not intended to replace advice given to you by your health care provider. Make sure you discuss any questions you have with your health care provider. Document Released: 11/08/2008 Document Revised: 06/20/2015 Document Reviewed: 02/16/2014 Elsevier Interactive Patient Education  2017 [employer].

## 2020-09-06 NOTE — Progress Notes (Addendum)
Virtual Visit via Telephone Note  I connected with  Jacob Frost on 09/06/20 at  2:30 PM EDT by telephone and verified that I am speaking with the correct person using two identifiers.  Medicare Annual Wellness visit completed telephonically due to Covid-19 pandemic.   Persons participating in this call: This Health Coach and this patient.   Location: Patient: Home Provider: Office   I discussed the limitations, risks, security and privacy concerns of performing an evaluation and management service by telephone and the availability of in person appointments. The patient expressed understanding and agreed to proceed.  Unable to perform video visit due to video visit attempted and failed and/or patient does not have video capability.   Some vital signs may be absent or patient reported.   Willette Brace, LPN   Subjective:   Jacob Frost is a 65 y.o. male who presents for an Initial Medicare Annual Wellness Visit.  Review of Systems     Cardiac Risk Factors include: advanced age (>44mn, >>55women);dyslipidemia;male gender;hypertension;obesity (BMI >30kg/m2)     Objective:    There were no vitals filed for this visit. There is no height or weight on file to calculate BMI.  Advanced Directives 09/06/2020  Does Patient Have a Medical Advance Directive? No  Would patient like information on creating a medical advance directive? No - Patient declined    Current Medications (verified) Outpatient Encounter Medications as of 09/06/2020  Medication Sig   aspirin 81 MG EC tablet Take 1 tablet by mouth daily.   atorvastatin (LIPITOR) 20 MG tablet Take 1 tablet (20 mg total) by mouth daily.   clobetasol (OLUX) 0.05 % topical foam Apply topically 2 (two) times daily.   lisinopril (ZESTRIL) 5 MG tablet Take 2 tablets (10 mg total) by mouth daily.   sildenafil (VIAGRA) 100 MG tablet TAKE 1 TABLET BY MOUTH ONCE DAILY AS NEEDED   zolpidem (AMBIEN) 10 MG tablet Take 1 tablet (10 mg  total) by mouth at bedtime as needed for sleep.   albuterol (PROAIR HFA) 108 (90 Base) MCG/ACT inhaler Inhale 2 puffs into the lungs every 4 (four) hours as needed for wheezing or shortness of breath. (Patient not taking: Reported on 09/06/2020)   Tiotropium Bromide-Olodaterol (STIOLTO RESPIMAT) 2.5-2.5 MCG/ACT AERS Inhale 2 puffs into the lungs daily. (Patient not taking: Reported on 09/06/2020)   No facility-administered encounter medications on file as of 09/06/2020.    Allergies (verified) Patient has no known allergies.   History: Past Medical History:  Diagnosis Date   Aortic atherosclerosis ([place]    COPD (chronic obstructive pulmonary disease) (HCC)    Emphysema of lung (HCC)    Hyperlipidemia    Hypertension    History reviewed. No pertinent surgical history. Family History  Problem Relation Age of Onset   Diabetes Father    Diabetes Sister    Hypertension Sister    Social History   Socioeconomic History   Marital status: Single    Spouse name: Not on file   Number of children: Not on file   Years of education: Not on file   Highest education level: Not on file  Occupational History   Not on file  Tobacco Use   Smoking status: Former    Packs/day: 2.00    Years: 37.00    Pack years: 74.00    Types: Cigarettes    Start date: 07/16/1968    Quit date: 07/16/2005    Years since quitting: 15.1   Smokeless tobacco: Never  Substance and Sexual Activity   Alcohol use: Never   Drug use: Never   Sexual activity: Not on file  Other Topics Concern   Not on file  Social History Narrative   Not on file   Social Determinants of Health   Financial Resource Strain: Low Risk    Difficulty of Paying Living Expenses: Not hard at all  Food Insecurity: No Food Insecurity   Worried About Running Out of Food in the Last Year: Never true   [place] in the Last Year: Never true  Transportation Needs: No Transportation Needs   Lack of Transportation (Medical): No   Lack  of Transportation (Non-Medical): No  Physical Activity: Inactive   Days of Exercise per Week: 0 days   Minutes of Exercise per Session: 0 min  Stress: No Stress Concern Present   Feeling of Stress : Not at all  Social Connections: Unknown   Frequency of Communication with Friends and Family: More than three times a week   Frequency of Social Gatherings with Friends and Family: Once a week   Attends Religious Services: Never   Marine scientist or Organizations: No   Attends Music therapist: Never   Marital Status: Not on file    Tobacco Counseling Counseling given: Not Answered   Clinical Intake:  Pre-visit preparation completed: Yes  Pain : No/denies pain     BMI - recorded: 33.08 Nutritional Status: BMI > 30  Obese Nutritional Risks: None Diabetes: No  How often do you need to have someone help you when you read instructions, pamphlets, or other written materials from your doctor or pharmacy?: 1 - Never  Diabetic?No  Interpreter Needed?: No  Information entered by :: Charlott Rakes, LPN   Activities of Daily Living In your present state of health, do you have any difficulty performing the following activities: 09/06/2020 02/22/2020  Hearing? N N  Vision? N N  Difficulty concentrating or making decisions? N N  Walking or climbing stairs? N N  Dressing or bathing? N N  Doing errands, shopping? N N  Preparing Food and eating ? N -  Using the Toilet? N -  In the past six months, have you accidently leaked urine? N -  Do you have problems with loss of bowel control? N -  Managing your Medications? N -  Managing your Finances? N -  Housekeeping or managing your Housekeeping? N -  Some recent data might be hidden    Patient Care Team: Vivi Barrack, MD as PCP - General (Family Medicine)  Indicate any recent Medical Services you may have received from other than [facility] providers in the past year (date may be approximate).     Assessment:    This is a routine wellness examination for Jacob Frost.  Hearing/Vision screen Hearing Screening - Comments:: Pt denies any hearing issues  Vision Screening - Comments:: Encouraged to follow up   Dietary issues and exercise activities discussed: Current Exercise Habits: The patient does not participate in regular exercise at present   Goals Addressed             This Visit's Progress    Patient Stated       None at this time        Depression Screen PHQ 2/9 Scores 09/06/2020 08/01/2020 05/01/2019  PHQ - 2 Score 0 0 0    Fall Risk Fall Risk  09/06/2020 08/01/2020 05/01/2019  Falls in the past year? 0 0 0  Number falls in past yr: 0 0 -  Injury with Fall? 0 0 -  Risk for fall due to : - No Fall Risks -  Follow up Falls prevention discussed - Falls evaluation completed    [place]:  Any stairs in or around the home? No  If so, are there any without handrails? No  Home free of loose throw rugs in walkways, pet beds, electrical cords, etc? Yes  Adequate lighting in your home to reduce risk of falls? Yes   ASSISTIVE DEVICES UTILIZED TO PREVENT FALLS:  Life alert? No  Use of a cane, walker or w/c? No  Grab bars in the bathroom? Yes  Shower chair or bench in shower? No  Elevated toilet seat or a handicapped toilet? No   TIMED UP AND GO:  Was the test performed? No .  Cognitive Function:     6CIT Screen 09/06/2020  What Year? 0 points  What month? 0 points  What time? 0 points  Count back from 20 0 points  Months in reverse 2 points  Repeat phrase 2 points  Total Score 4    Immunizations Immunization History  Administered Date(s) Administered   Influenza Whole 12/11/2008, 12/10/2009   Influenza, High Dose Seasonal PF 11/18/2019   Influenza,inj,Quad PF,6+ Mos 11/04/2015, 10/20/2017, 10/24/2018   Influenza-Unspecified 11/26/2016, 10/20/2017, 11/19/2019   PFIZER(Purple Top)SARS-COV-2 Vaccination 04/01/2019, 04/21/2019   Pneumococcal  Conjugate-13 12/02/2017   Pneumococcal Polysaccharide-23 02/08/2009, 02/22/2020   Td 07/04/2008   Tdap 07/04/2008    TDAP status: Due, Education has been provided regarding the importance of this vaccine. Advised may receive this vaccine at local pharmacy or Health Dept. Aware to provide a copy of the vaccination record if obtained from local pharmacy or Health Dept. Verbalized acceptance and understanding.  Flu Vaccine status: Due, Education has been provided regarding the importance of this vaccine. Advised may receive this vaccine at local pharmacy or Health Dept. Aware to provide a copy of the vaccination record if obtained from local pharmacy or Health Dept. Verbalized acceptance and understanding.  Pneumococcal vaccine status: Up to date  Covid-19 vaccine status: Completed vaccines  Qualifies for Shingles Vaccine? Yes   Zostavax completed No   Shingrix Completed?: No.    Education has been provided regarding the importance of this vaccine. Patient has been advised to call insurance company to determine out of pocket expense if they have not yet received this vaccine. Advised may also receive vaccine at local pharmacy or Health Dept. Verbalized acceptance and understanding.  Screening Tests Health Maintenance  Topic Date Due   Zoster Vaccines- Shingrix (1 of 2) Never done   COLONOSCOPY (Pts 45-39yr Insurance coverage will need to be confirmed)  Never done   TETANUS/TDAP  07/05/2018   COVID-19 Vaccine (3 - Pfizer risk series) 05/19/2019   INFLUENZA VACCINE  08/26/2020   Hepatitis C Screening  Completed   HIV Screening  Completed   PNA vac Low Risk Adult  Completed   HPV VACCINES  Aged Out    Health Maintenance  Health Maintenance Due  Topic Date Due   Zoster Vaccines- Shingrix (1 of 2) Never done   COLONOSCOPY (Pts 45-481yrInsurance coverage will need to be confirmed)  Never done   TETANUS/TDAP  07/05/2018   COVID-19 Vaccine (3 - Pfizer risk series) 05/19/2019    INFLUENZA VACCINE  08/26/2020    Colorecatl screening pt stated he will get one with prior MD    Additional Screening:  Hepatitis C Screening:  Completed 10/20/17  Vision Screening: Recommended annual ophthalmology exams for early detection of glaucoma and other disorders of the eye. Is the patient up to date with their annual eye exam?  No  Who is the provider or what is the name of the office in which the patient attends annual eye exams? Encouraged to follow uup If pt is not established with a provider, would they like to be referred to a provider to establish care? No .   Dental Screening: Recommended annual dental exams for proper oral hygiene  Community Resource Referral / Chronic Care Management: CRR required this visit?  No   CCM required this visit?  No      Plan:     I have personally reviewed and noted the following in the patient's chart:   Medical and social history Use of alcohol, tobacco or illicit drugs  Current medications and supplements including opioid prescriptions. Patient is not currently taking opioid prescriptions. Functional ability and status Nutritional status Physical activity Advanced directives List of other physicians Hospitalizations, surgeries, and ER visits in previous 12 months Vitals Screenings to include cognitive, depression, and falls Referrals and appointments  In addition, I have reviewed and discussed with patient certain preventive protocols, quality metrics, and best practice recommendations. A written personalized care plan for preventive services as well as general preventive health recommendations were provided to patient.     Willette Brace, LPN   [id number]   Nurse Notes: None

## 2020-09-11 ENCOUNTER — Telehealth: Payer: Self-pay | Admitting: Gastroenterology

## 2020-09-11 NOTE — Telephone Encounter (Signed)
Good morning Dr. Candis Schatz, we received a referral for patient to have a colonoscopy.  Patient's last colon was in 2019.  Will send records to you.  Can you please review and advise on scheduling?  Thank you.

## 2020-09-11 NOTE — Telephone Encounter (Signed)
Rx refill send to pharmacy

## 2020-09-18 NOTE — Telephone Encounter (Signed)
Spoke with patient. He will bring records to the office

## 2020-09-18 NOTE — Telephone Encounter (Signed)
Called patient and asked if he can have colon reports before 2019.  Will have records faxed over.

## 2020-09-24 ENCOUNTER — Telehealth: Payer: Self-pay | Admitting: *Deleted

## 2020-09-24 NOTE — Telephone Encounter (Signed)
[facility] requesting refills Rx Aspirin '81mg'$ 

## 2020-09-24 NOTE — Telephone Encounter (Signed)
Ok with me. Please place any necessary orders. 

## 2020-09-25 MED ORDER — ASPIRIN 81 MG PO TBEC: 81.0000 mg | DELAYED_RELEASE_TABLET | Freq: Every day | ORAL | 5 refills | Status: AC

## 2020-09-25 NOTE — Telephone Encounter (Signed)
Rx refilled.

## 2020-10-02 NOTE — Telephone Encounter (Signed)
Spoke with patient and he is aware of Dr. Dayle Points recommendations will place recall in Epic.

## 2020-10-03 ENCOUNTER — Other Ambulatory Visit: Payer: Self-pay

## 2020-10-03 ENCOUNTER — Ambulatory Visit (INDEPENDENT_AMBULATORY_CARE_PROVIDER_SITE_OTHER): Payer: Medicare HMO

## 2020-10-03 DIAGNOSIS — Z23 Encounter for immunization: Secondary | ICD-10-CM | POA: Diagnosis not present

## 2020-10-10 ENCOUNTER — Telehealth: Payer: Self-pay

## 2020-10-10 NOTE — Telephone Encounter (Signed)
LAST APPOINTMENT DATE:  08/01/20  NEXT APPOINTMENT DATE: 02/04/21  MEDICATION:lisinopril (ZESTRIL) 5 MG tablet  [place]

## 2020-10-11 ENCOUNTER — Other Ambulatory Visit: Payer: Self-pay | Admitting: *Deleted

## 2020-10-11 MED ORDER — LISINOPRIL 5 MG PO TABS: 10.0000 mg | ORAL_TABLET | Freq: Every day | ORAL | 0 refills | Status: DC

## 2020-10-11 NOTE — Telephone Encounter (Signed)
Rx send to [facility]  

## 2020-10-15 ENCOUNTER — Telehealth: Payer: Self-pay

## 2020-10-15 NOTE — Telephone Encounter (Signed)
Patient is calling in stating that his Lisinopril instructions states he is supposed to be taking two tablets daily when he only takes one, wondering if this can be updated.

## 2020-10-15 NOTE — Telephone Encounter (Signed)
Please advise 

## 2020-10-15 NOTE — Telephone Encounter (Signed)
He should be on lisinopril 5mg  daily. Please update chart.  Algis Greenhouse. Jerline Pain, MD 10/15/2020 3:57 PM

## 2020-10-17 ENCOUNTER — Other Ambulatory Visit: Payer: Self-pay | Admitting: *Deleted

## 2020-10-17 MED ORDER — LISINOPRIL 5 MG PO TABS: 5.0000 mg | ORAL_TABLET | Freq: Every day | ORAL | 0 refills | Status: DC

## 2020-10-17 NOTE — Telephone Encounter (Signed)
Patient aware.

## 2020-12-06 ENCOUNTER — Telehealth (HOSPITAL_BASED_OUTPATIENT_CLINIC_OR_DEPARTMENT_OTHER): Payer: Self-pay

## 2020-12-26 ENCOUNTER — Other Ambulatory Visit: Payer: Self-pay | Admitting: Family Medicine

## 2021-01-23 ENCOUNTER — Other Ambulatory Visit: Payer: Self-pay

## 2021-01-23 ENCOUNTER — Ambulatory Visit (HOSPITAL_BASED_OUTPATIENT_CLINIC_OR_DEPARTMENT_OTHER)
Admission: RE | Admit: 2021-01-23 | Discharge: 2021-01-23 | Disposition: A | Discharge status: Home / Self Care | Payer: Medicare HMO | Source: Ambulatory Visit | Attending: Emergency Medicine | Admitting: Emergency Medicine

## 2021-01-23 DIAGNOSIS — Z87891 Personal history of nicotine dependence: Secondary | ICD-10-CM | POA: Insufficient documentation

## 2021-01-24 ENCOUNTER — Telehealth: Payer: Self-pay | Admitting: Emergency Medicine

## 2021-01-24 NOTE — Telephone Encounter (Signed)
Received call report from [facility] with [facility] on patient's CT lung cancer screen CT done on 01/23/21. Sarah please review the result/impression copied below:  Sending this to both Dr. Lamonte Sakai and Eric Form, but sending it to Judson Roch as RB is off until 01/28/21 and since SG is familiar with these CTs with the lung cancer screening program.  Please advise, thank you. IMPRESSION: 1. Lung-RADS 4A, suspicious. Follow up low-dose chest CT without contrast in 3 months (please use the following order, "CT CHEST LCS NODULE FOLLOW-UP W/O CM") is recommended. Interval growth of irregular small medial right lower lobe pulmonary nodule measuring 6.2 mm in volume derived mean diameter. 2. Two-vessel coronary atherosclerosis. 3. Diffuse hepatic steatosis. 4. Stable right adrenal adenoma. 5. Aortic Atherosclerosis (ICD10-I70.0) and Emphysema (ICD10-J43.9).

## 2021-01-28 NOTE — Telephone Encounter (Signed)
Please let the pt know that I reviewed his CT scan, and that there is slight enlargement of his small pulmonary nodule that we have been following. I would like for him to have a repeat Ct chest in 6 months to follow this, then see me in office to discuss.   Order SuperD Ct chest, no contrast for late June 2023. Thanks.

## 2021-01-28 NOTE — Telephone Encounter (Signed)
Called patient and discussed his most recent CT chest finding with him.  I have strongly recommended that we continue to follow his pulmonary nodule that had some slight interval change.  He is frustrated because he was not sure what the scans meant, has not seen the scans or had the nodules identified for him.  I am happy to see him in the office and go over the films in more detail.  I recommended that he have a super D CT 6 months following the recent screening exam.  He is willing to do this.  I will work on scheduling it when I see him in the office.  Please schedule patient for 01/31/21, thanks

## 2021-01-28 NOTE — Telephone Encounter (Signed)
Noted. This was routed to Dr. Lamonte Sakai as well as SG on 12/30.

## 2021-01-28 NOTE — Telephone Encounter (Signed)
Called and spoke with pt letting him know results of CT per RB and stated to him that RB would like for pt to have the CT repeated in 6 months and have an OV after to discuss the results.  After stating that to pt, pt stated that he did not want to have another CT performed nor did he want to have an OV scheduled either.  Routing to Dr. Lamonte Sakai as an Juluis Rainier.

## 2021-01-28 NOTE — Telephone Encounter (Signed)
Called and spoke with patient. I was able to get him scheduled to see RB on 01/31/21 at 130pm. He is aware of the office location and phone number.   Nothing further needed at time of call.

## 2021-01-31 ENCOUNTER — Other Ambulatory Visit: Payer: Self-pay

## 2021-01-31 ENCOUNTER — Ambulatory Visit: Payer: Medicare HMO | Admitting: Emergency Medicine

## 2021-01-31 ENCOUNTER — Encounter: Payer: Self-pay | Admitting: Emergency Medicine

## 2021-01-31 DIAGNOSIS — J449 Chronic obstructive pulmonary disease, unspecified: Secondary | ICD-10-CM

## 2021-01-31 DIAGNOSIS — R918 Other nonspecific abnormal finding of lung field: Secondary | ICD-10-CM | POA: Diagnosis not present

## 2021-01-31 NOTE — Assessment & Plan Note (Signed)
Moderately severe obstruction with a positive bronchodilator response.  He is not on scheduled bronchodilator therapy.  He does sometimes use albuterol.  If he changes clinically than I would favor retrying LABA/LAMA.  Discussed this with him today.

## 2021-01-31 NOTE — Assessment & Plan Note (Signed)
Multiple small pulmonary nodules on lung cancer screening CT scans that have been for the most part stable but he has a right lower lobe pulmonary nodule that appears to have enlarged from 4 mm to 6 mm in 1 year.  We will check a super D CT in 3 months, look for interval stability.  Depending on its interval change we will decide whether to evaluate further, possibly with PET scan, possibly with navigational bronchoscopy.  He will follow-up with me after to review.

## 2021-01-31 NOTE — Patient Instructions (Signed)
We will repeat your CT scan of the chest in late March 2023 to follow a small right lower lobe pulmonary nodule. Keep your albuterol available to use 2 puffs if needed for shortness of breath, chest tightness, wheezing. We will not start a scheduled inhaled medication right now but will consider doing so if you have a change in your breathing, functional capacity. Follow Dr. Lamonte Sakai after your CT scan so we can review the results together.

## 2021-01-31 NOTE — Progress Notes (Signed)
Subjective:    Patient ID: Jacob Frost, male    DOB: May 21, 1955, 66 y.o.   MRN: [id number]  HPI Follow-up visit for 66 year old former smoker with COPD, hypertension, hypercholesterolemia.  Last seen January 2022.  He has obstruction with a positive bronchodilator response. Currently using  Participates in lung cancer screening program, has had some small calcified and noncalcified pulmonary nodules that have been followed.  Most recent CT 01/23/2021 as below.  LDCT 01/23/2021 reviewed by me, shows no mediastinal or hilar adenopathy.  There is a 6.2 mm irregular medial right lower lobe pulmonary nodule that was 4 mm on his prior study that will need shorter interval follow-up.  No other changes in his chronic nodules.  Review of Systems As per HPI  Past Medical History:  Diagnosis Date   Aortic atherosclerosis (HCC)    COPD (chronic obstructive pulmonary disease) ([place])    Emphysema of lung ([place])    Hyperlipidemia    Hypertension      Family History  Problem Relation Age of Onset   Diabetes Father    Diabetes Sister    Hypertension Sister      Social History   Socioeconomic History   Marital status: Single    Spouse name: Not on file   Number of children: Not on file   Years of education: Not on file   Highest education level: Not on file  Occupational History   Not on file  Tobacco Use   Smoking status: Former    Packs/day: 2.00    Years: 37.00    Pack years: 74.00    Types: Cigarettes    Start date: 07/16/1968    Quit date: 07/16/2005    Years since quitting: 15.5   Smokeless tobacco: Never  Substance and Sexual Activity   Alcohol use: Never   Drug use: Never   Sexual activity: Not on file  Other Topics Concern   Not on file  Social History Narrative   Not on file   Social Determinants of Health   Financial Resource Strain: Low Risk    Difficulty of Paying Living Expenses: Not hard at all  Food Insecurity: No Food Insecurity   Worried About Paediatric nurse in the Last Year: Never true   Ran Out of Food in the Last Year: Never true  Transportation Needs: No Transportation Needs   Lack of Transportation (Medical): No   Lack of Transportation (Non-Medical): No  Physical Activity: Inactive   Days of Exercise per Week: 0 days   Minutes of Exercise per Session: 0 min  Stress: No Stress Concern Present   Feeling of Stress : Not at all  Social Connections: Unknown   Frequency of Communication with Friends and Family: More than three times a week   Frequency of Social Gatherings with Friends and Family: Once a week   Attends Religious Services: Never   Marine scientist or Organizations: No   Attends Music therapist: Never   Marital Status: Not on file  Intimate Partner Violence: Not At Risk   Fear of Current or Ex-Partner: No   Emotionally Abused: No   Physically Abused: No   Sexually Abused: No     No Known Allergies   Outpatient Medications Prior to Visit  Medication Sig Dispense Refill   albuterol (PROAIR HFA) 108 (90 Base) MCG/ACT inhaler Inhale 2 puffs into the lungs every 4 (four) hours as needed for wheezing or shortness of breath. 1 Inhaler 3  atorvastatin (LIPITOR) 20 MG tablet Take 1 tablet (20 mg total) by mouth daily. 90 tablet 3   clobetasol (OLUX) 0.05 % topical foam Apply topically 2 (two) times daily. 100 g 0   lisinopril (ZESTRIL) 5 MG tablet Take 2 tablets by mouth once daily 90 tablet 0   sildenafil (VIAGRA) 100 MG tablet TAKE 1 TABLET BY MOUTH ONCE DAILY AS NEEDED 30 tablet 0   Tiotropium Bromide-Olodaterol (STIOLTO RESPIMAT) 2.5-2.5 MCG/ACT AERS Inhale 2 puffs into the lungs daily. 2 Inhaler 0   zolpidem (AMBIEN) 10 MG tablet Take 1 tablet (10 mg total) by mouth at bedtime as needed for sleep. 30 tablet 2   No facility-administered medications prior to visit.         Objective:   Physical Exam  Vitals:   01/31/21 1331  BP: 130/82  Pulse: 91  Temp: 98.1 F (36.7 C)  TempSrc:  Oral  SpO2: 94%  Weight: 228 lb 9.6 oz (103.7 kg)  Height: 5\' 9"  (1.753 m)   Gen: Pleasant, well-nourished, in no distress,  normal affect  ENT: No lesions,  mouth clear,  oropharynx clear, no postnasal drip  Neck: No JVD, no stridor  Lungs: No use of accessory muscles, no crackles or wheezing on normal respiration, no wheeze on forced expiration  Cardiovascular: RRR, heart sounds normal, no murmur or gallops, no peripheral edema  Musculoskeletal: No deformities, no cyanosis or clubbing  Neuro: alert, awake, non focal  Skin: Warm, no lesions or rash     Assessment & Plan:   Pulmonary nodules Multiple small pulmonary nodules on lung cancer screening CT scans that have been for the most part stable but he has a right lower lobe pulmonary nodule that appears to have enlarged from 4 mm to 6 mm in 1 year.  We will check a super D CT in 3 months, look for interval stability.  Depending on its interval change we will decide whether to evaluate further, possibly with PET scan, possibly with navigational bronchoscopy.  He will follow-up with me after to review.  Chronic obstructive pulmonary disease (HCC) Moderately severe obstruction with a positive bronchodilator response.  He is not on scheduled bronchodilator therapy.  He does sometimes use albuterol.  If he changes clinically than I would favor retrying LABA/LAMA.  Discussed this with him today.  Time spent 41 minutes  Baltazar Apo, MD, PhD 01/31/2021, 2:06 PM [facility] [phone] or if no answer before 7:00PM call [phone] For any issues after 7:00PM please call eLink [phone]

## 2021-01-31 NOTE — Addendum Note (Signed)
Addended by: Gavin Potters R on: 01/31/2021 02:13 PM   Modules accepted: Orders

## 2021-02-04 ENCOUNTER — Ambulatory Visit (INDEPENDENT_AMBULATORY_CARE_PROVIDER_SITE_OTHER): Payer: Medicare HMO | Admitting: Family Medicine

## 2021-02-04 ENCOUNTER — Encounter: Payer: Self-pay | Admitting: Family Medicine

## 2021-02-04 ENCOUNTER — Other Ambulatory Visit: Payer: Self-pay

## 2021-02-04 VITALS — BP 118/81 | HR 78 | Temp 98.4°F | Ht 69.0 in | Wt 230.8 lb

## 2021-02-04 DIAGNOSIS — Z1211 Encounter for screening for malignant neoplasm of colon: Secondary | ICD-10-CM

## 2021-02-04 DIAGNOSIS — I10 Essential (primary) hypertension: Secondary | ICD-10-CM

## 2021-02-04 DIAGNOSIS — R739 Hyperglycemia, unspecified: Secondary | ICD-10-CM

## 2021-02-04 DIAGNOSIS — I700 Atherosclerosis of aorta: Secondary | ICD-10-CM

## 2021-02-04 DIAGNOSIS — E785 Hyperlipidemia, unspecified: Secondary | ICD-10-CM | POA: Diagnosis not present

## 2021-02-04 LAB — COMPREHENSIVE METABOLIC PANEL
ALT: 50 U/L (ref 0–53)
AST: 25 U/L (ref 0–37)
Albumin: 4.4 g/dL (ref 3.5–5.2)
Alkaline Phosphatase: 132 U/L — ABNORMAL HIGH (ref 39–117)
BUN: 16 mg/dL (ref 6–23)
CO2: 29 mEq/L (ref 19–32)
Calcium: 10 mg/dL (ref 8.4–10.5)
Chloride: 104 mEq/L (ref 96–112)
Creatinine, Ser: 0.8 mg/dL (ref 0.40–1.50)
GFR: 92.32 mL/min (ref >60.00)
Glucose, Bld: 95 mg/dL (ref 70–99)
Potassium: 5 mEq/L (ref 3.5–5.1)
Sodium: 139 mEq/L (ref 135–145)
Total Bilirubin: 0.6 mg/dL (ref 0.2–1.2)
Total Protein: 6.9 g/dL (ref 6.0–8.3)

## 2021-02-04 LAB — CBC
HCT: 47.4 % (ref 39.0–52.0)
Hemoglobin: 15.5 g/dL (ref 13.0–17.0)
MCHC: 32.6 g/dL (ref 30.0–36.0)
MCV: 91.5 fl (ref 78.0–100.0)
Platelets: 216 10*3/uL (ref 150.0–400.0)
RBC: 5.18 Mil/uL (ref 4.22–5.81)
RDW: 13.5 % (ref 11.5–15.5)
WBC: 7.3 10*3/uL (ref 4.0–10.5)

## 2021-02-04 LAB — LIPID PANEL
Cholesterol: 137 mg/dL (ref 0–200)
HDL: 27.1 mg/dL — ABNORMAL LOW (ref >39.00)
LDL Cholesterol: 82 mg/dL (ref 0–99)
NonHDL: 110.11
Total CHOL/HDL Ratio: 5
Triglycerides: 143 mg/dL (ref 0.0–149.0)
VLDL: 28.6 mg/dL (ref 0.0–40.0)

## 2021-02-04 LAB — HEMOGLOBIN A1C: Hgb A1c MFr Bld: 6.1 % (ref 4.6–6.5)

## 2021-02-04 MED ORDER — LISINOPRIL 5 MG PO TABS: 5.0000 mg | ORAL_TABLET | Freq: Every day | ORAL | 0 refills | Status: DC

## 2021-02-04 MED ORDER — ALBUTEROL SULFATE HFA 108 (90 BASE) MCG/ACT IN AERS: 2.0000 | INHALATION_SPRAY | RESPIRATORY_TRACT | 3 refills | Status: AC | PRN

## 2021-02-04 NOTE — Assessment & Plan Note (Signed)
Check A1c. 

## 2021-02-04 NOTE — Assessment & Plan Note (Signed)
Check labs.  Ideally would like to get his LDL to 70 or lower.  He is on Lipitor 10 mg daily.  May increase dose of statin if tolerated depending on lipid panel results.

## 2021-02-04 NOTE — Assessment & Plan Note (Signed)
At goal on lisinopril 5 mg daily.  Continue home monitoring.  Follow-up 6 months.

## 2021-02-04 NOTE — Patient Instructions (Signed)
It was very nice to see you today!  We will check blood work.  We can check a stool sample to make sure you do not need a colonoscopy before your full 7 to 10 years.  We will see back in about 6 months for your regular checkup.  Come back sooner if needed.  Take care, Dr Jerline Pain  PLEASE NOTE:  If you had any lab tests please let us know if you have not heard back within a few days. You may see your results on mychart before we have a chance to review them but we will give you a call once they are reviewed by [place]. If we ordered any referrals today, please let us know if you have not heard from their office within the next week.   Please try these tips to maintain a healthy lifestyle:  Eat at least 3 REAL meals and 1-2 snacks per day.  Aim for no more than 5 hours between eating.  If you eat breakfast, please do so within one hour of getting up.   Each meal should contain half fruits/vegetables, one quarter protein, and one quarter carbs (no bigger than a computer mouse)  Cut down on sweet beverages. This includes juice, soda, and sweet tea.   Drink at least 1 glass of water with each meal and aim for at least 8 glasses per day  Exercise at least 150 minutes every week.

## 2021-02-04 NOTE — Assessment & Plan Note (Signed)
On statin.

## 2021-02-04 NOTE — Progress Notes (Signed)
° °  Jacob Frost is a 66 y.o. male who presents today for an office visit.  Assessment/Plan:  Chronic Problems Addressed Today: Essential hypertension At goal on lisinopril 5 mg daily.  Continue home monitoring.  Follow-up 6 months.  Hyperlipidemia Check labs.  Ideally would like to get his LDL to 70 or lower.  He is on Lipitor 10 mg daily.  May increase dose of statin if tolerated depending on lipid panel results.  Hyperglycemia Check A1c.  Aortic atherosclerosis (HCC) On statin.   Preventative Healthcare Due for colonoscopy in 2026 per GI.  Last was done in 2019.  He is anxious about waiting this long.  We will check Hemoccult cards in the interim.     Subjective:  HPI:  He is here to 6 months follow up.   He is following up with his pulmonologist for this lung nodules.   He has been doing well otherwise. He is on Lisinopril 5 mg daily for hypertension. He is compliant with his medication. Denies any side effects. He has not had any episode of elevated blood pressure readings. Denies headaches, chest pain or fatigue.   He is on Lipitor 20 mg daily for hyperlipidemia. He is tolerating his medication well without any side effects. He has been trying to follow a healthy diet. Additionally, he is requesting refill on albuterol today.       Objective:  Physical Exam: BP 118/81    Pulse 78    Temp 98.4 F (36.9 C) (Temporal)    Ht 5\' 9"  (1.753 m)    Wt 230 lb 12.8 oz (104.7 kg)    SpO2 96%    BMI 34.08 kg/m   Gen: No acute distress, resting comfortably CV: Regular rate and rhythm with no murmurs appreciated Pulm: Normal work of breathing, clear to auscultation bilaterally with no crackles, wheezes, or rhonchi Neuro: Grossly normal, moves all extremities Psych: Normal affect and thought content       I,Savera Zaman,acting as a scribe for Dimas Chyle, MD.,have documented all relevant documentation on the behalf of Dimas Chyle, MD,as directed by  Dimas Chyle, MD while in  the presence of Dimas Chyle, MD.   I, Dimas Chyle, MD, have reviewed all documentation for this visit. The documentation on 02/04/21 for the exam, diagnosis, procedures, and orders are all accurate and complete.  Algis Greenhouse. Jerline Pain, MD 02/04/2021 10:25 AM

## 2021-02-06 NOTE — Progress Notes (Signed)
Please inform patient of the following:  His LDL is slightly above goal.  His A1c is borderline but stable.  Everything else is stable.  Recommend increasing Lipitor to 40 mg daily.  Please send in  Rx.  We should recheck in 6 to 12 months.

## 2021-02-10 ENCOUNTER — Other Ambulatory Visit: Payer: Self-pay | Admitting: *Deleted

## 2021-02-10 MED ORDER — ATORVASTATIN CALCIUM 40 MG PO TABS: 40.0000 mg | ORAL_TABLET | Freq: Every day | ORAL | 3 refills | Status: DC

## 2021-02-14 ENCOUNTER — Telehealth: Payer: Self-pay | Admitting: Emergency Medicine

## 2021-02-17 NOTE — Telephone Encounter (Signed)
Collette - will you please contact this patient to r/s this CT?

## 2021-03-03 NOTE — Telephone Encounter (Signed)
Jacob Frost has authorized & scheduled this appt.  Nothing further needed at this time.

## 2021-04-01 ENCOUNTER — Other Ambulatory Visit: Payer: Self-pay

## 2021-04-01 DIAGNOSIS — Z1211 Encounter for screening for malignant neoplasm of colon: Secondary | ICD-10-CM

## 2021-04-01 NOTE — Addendum Note (Signed)
Addended by: Loura Back on: 04/01/2021 12:38 PM   Modules accepted: Orders

## 2021-04-02 ENCOUNTER — Telehealth: Payer: Self-pay

## 2021-04-02 LAB — FECAL OCCULT BLOOD, IMMUNOCHEMICAL: Fecal Occult Bld: POSITIVE — AB

## 2021-04-02 NOTE — Telephone Encounter (Signed)
Positive IFOB results  ?

## 2021-04-03 ENCOUNTER — Telehealth: Payer: Self-pay | Admitting: Family Medicine

## 2021-04-03 ENCOUNTER — Other Ambulatory Visit: Payer: Self-pay | Admitting: *Deleted

## 2021-04-03 DIAGNOSIS — K921 Melena: Secondary | ICD-10-CM

## 2021-04-03 NOTE — Telephone Encounter (Signed)
See results note. 

## 2021-04-03 NOTE — Telephone Encounter (Signed)
See results note. GI referral placed  ?

## 2021-04-03 NOTE — Progress Notes (Signed)
Please inform patient of the following:  His FOB showed he has blood in his stool. This is most likely nothing serious but he needs to get a colonoscopy. Please place referral.  Marik Sedore M. Jerline Pain, MD 04/03/2021 8:03 AM

## 2021-04-03 NOTE — Telephone Encounter (Signed)
Pt states he received his lab results on MyChart and would like to discuss them. Please call back.  ?

## 2021-04-03 NOTE — Telephone Encounter (Signed)
Please let patient know his test came back positive for blood in his stool and he will need to be referred to GI for colonoscopy. ? ?Jacob Frost. Jerline Pain, MD ?04/03/2021 7:56 AM  ? ?

## 2021-04-11 ENCOUNTER — Other Ambulatory Visit: Payer: Medicare HMO

## 2021-04-25 ENCOUNTER — Ambulatory Visit: Payer: Medicare HMO | Admitting: Emergency Medicine

## 2021-04-25 ENCOUNTER — Ambulatory Visit: Payer: Medicare HMO | Admitting: Gastroenterology

## 2021-04-25 ENCOUNTER — Encounter: Payer: Self-pay | Admitting: Gastroenterology

## 2021-04-25 VITALS — BP 112/76 | HR 72 | Ht 69.0 in | Wt 230.0 lb

## 2021-04-25 DIAGNOSIS — R195 Other fecal abnormalities: Secondary | ICD-10-CM

## 2021-04-25 DIAGNOSIS — Z86010 Personal history of colonic polyps: Secondary | ICD-10-CM | POA: Diagnosis not present

## 2021-04-25 DIAGNOSIS — I700 Atherosclerosis of aorta: Secondary | ICD-10-CM | POA: Diagnosis not present

## 2021-04-25 DIAGNOSIS — J439 Emphysema, unspecified: Secondary | ICD-10-CM | POA: Diagnosis not present

## 2021-04-25 DIAGNOSIS — R918 Other nonspecific abnormal finding of lung field: Secondary | ICD-10-CM | POA: Diagnosis not present

## 2021-04-25 DIAGNOSIS — D3501 Benign neoplasm of right adrenal gland: Secondary | ICD-10-CM | POA: Diagnosis not present

## 2021-04-25 MED ORDER — SUTAB 1479-225-188 MG PO TABS: 1.0000 | ORAL_TABLET | Freq: Once | ORAL | 0 refills | Status: AC

## 2021-04-25 NOTE — Progress Notes (Signed)
? ?HPI : Jacob Frost is a very pleasant 66 year old male with a history of COPD and colon polyps who is referred to [place] by Dr. Dimas Chyle because of a positive FOBT.  The patient states that he had been getting colonoscopies every three years in [facility] because of polyps.  The colonoscopy reports are not available for review at this time, but previous notes indicate his last colonoscopy was in Nov 2019 and was notable for 2 small tubular adenomas and 1 hyperplastic polyp and he was recommended to repeat in 3 years.   ?His colonoscopy prior to that was in 2016 and 5 small polyps were removed.  The actual report and pathology report not available but Dr. Latricia Heft not indicates these were all adenomas.  ?The patient underwent an FOBT at the patient's request earlier this month which was positive. ?He denies any GI symptoms to include abdominal pain, constipation, diarrhea or blood in the stool.  No upper GI symptoms such as frequent heartburn, acid regurgitation, dysphagia or nausea/vomiting.  Weight is stable. ?No family history of colon cancer. ?He has COPD which has been stable.  He does get short of breath with minimal physical activity, but has not been recommended for oxygen.  He used to walk three miles a day, but has gotten out of this habit.  He does do yard work/gardening and denies being significantly limited by dyspnea for this activity.  ? ?Past Medical History:  ?Diagnosis Date  ? Aortic atherosclerosis ([place])   ? COPD (chronic obstructive pulmonary disease) ([place])   ? Emphysema of lung ([place])   ? Hyperlipidemia   ? Hypertension   ? ?Colonoscopy 2019:  3 small polyps, (2 TAs, 1 HP) ? ?Colonoscopy Dec 2016: Small polyp transverse colon removed with hot biopsy forceps, 4 small polyps in sigmoid colon removed with hot forceps ? ?Colonoscopy Oct 2011:  Adenomatous polyps, hyperplastic polyps (referenced from Dr. Chesley Noon note Dec 31, 2014 ? ?Colonoscopy 2008:  No report/findings ? ?No past surgical history on  file. ?Family History  ?Problem Relation Age of Onset  ? Diabetes Father   ? Diabetes Sister   ? Hypertension Sister   ? ?Social History  ? ?Tobacco Use  ? Smoking status: Former  ?  Packs/day: 2.00  ?  Years: 37.00  ?  Pack years: 74.00  ?  Types: Cigarettes  ?  Start date: 07/16/1968  ?  Quit date: 07/16/2005  ?  Years since quitting: 15.7  ? Smokeless tobacco: Never  ?Substance Use Topics  ? Alcohol use: Never  ? Drug use: Never  ? ?Current Outpatient Medications  ?Medication Sig Dispense Refill  ? albuterol (PROAIR HFA) 108 (90 Base) MCG/ACT inhaler Inhale 2 puffs into the lungs every 4 (four) hours as needed for wheezing or shortness of breath. 1 each 3  ? atorvastatin (LIPITOR) 40 MG tablet Take 1 tablet (40 mg total) by mouth daily. 90 tablet 3  ? clobetasol (OLUX) 0.05 % topical foam Apply topically 2 (two) times daily. 100 g 0  ? lisinopril (ZESTRIL) 5 MG tablet Take 1 tablet (5 mg total) by mouth daily. 90 tablet 0  ? sildenafil (VIAGRA) 100 MG tablet TAKE 1 TABLET BY MOUTH ONCE DAILY AS NEEDED 30 tablet 0  ? Tiotropium Bromide-Olodaterol (STIOLTO RESPIMAT) 2.5-2.5 MCG/ACT AERS Inhale 2 puffs into the lungs daily. 2 Inhaler 0  ? zolpidem (AMBIEN) 10 MG tablet Take 1 tablet (10 mg total) by mouth at bedtime as needed for sleep. [place]  tablet 2  ? ?No current facility-administered medications for this visit.  ? ?No Known Allergies ? ? ?Review of Systems: ?All systems reviewed and negative except where noted in HPI.  ? ? ?No results found. ? ?Physical Exam: ?BP 112/76   Pulse 72   Ht '5\' 9"'$  (1.753 m)   Wt 230 lb (104.3 kg)   SpO2 96%   BMI 33.97 kg/m?  ?Constitutional: Pleasant,well-developed, Caucasian male in no acute distress. ?HEENT: Normocephalic and atraumatic. Conjunctivae are normal. No scleral icterus. ?Neck supple.  ?Cardiovascular: Normal rate, regular rhythm.  ?Pulmonary/chest: Effort normal and breath sounds normal. No wheezing, rales or rhonchi. ?Abdominal: Soft, nondistended, nontender. Bowel  sounds active throughout. There are no masses palpable. No hepatomegaly. ?Extremities: no edema ?Neurological: Alert and oriented to person place and time. ?Skin: Skin is warm and dry. No rashes noted. ?Psychiatric: Normal mood and affect. Behavior is normal. ? ?CBC ?   ?Component Value Date/Time  ? WBC 7.3 02/04/2021 1024  ? RBC 5.18 02/04/2021 1024  ? HGB 15.5 02/04/2021 1024  ? HCT 47.4 02/04/2021 1024  ? PLT 216.0 02/04/2021 1024  ? MCV 91.5 02/04/2021 1024  ? MCHC 32.6 02/04/2021 1024  ? RDW 13.5 02/04/2021 1024  ? LYMPHSABS 2.4 04/05/2009 1859  ? MONOABS 0.8 04/05/2009 1859  ? EOSABS 0.4 04/05/2009 1859  ? BASOSABS 0.1 04/05/2009 1859  ? ? ?CMP  ?   ?Component Value Date/Time  ? NA 139 02/04/2021 1024  ? NA 140 05/23/2019 0811  ? K 5.0 02/04/2021 1024  ? CL 104 02/04/2021 1024  ? CO2 29 02/04/2021 1024  ? GLUCOSE 95 02/04/2021 1024  ? BUN 16 02/04/2021 1024  ? BUN 20 05/23/2019 0811  ? CREATININE 0.80 02/04/2021 1024  ? CALCIUM 10.0 02/04/2021 1024  ? PROT 6.9 02/04/2021 1024  ? PROT 7.1 05/23/2019 0811  ? ALBUMIN 4.4 02/04/2021 1024  ? ALBUMIN 4.8 05/23/2019 0811  ? AST 25 02/04/2021 1024  ? ALT 50 02/04/2021 1024  ? ALKPHOS 132 (H) 02/04/2021 1024  ? BILITOT 0.6 02/04/2021 1024  ? BILITOT 0.4 05/23/2019 0811  ? GFRNONAA 82 05/23/2019 0811  ? GFRAA 95 05/23/2019 0811  ? ?Component Ref Range & Units 3 wk ago  ?Fecal Occult Bld Negative Positive Abnormal    ? ? ? ?ASSESSMENT AND PLAN: ?66 year old male with COPD and a history of small adenomas (2 in 2019) who was recently had a positive FOBT.  Although by current guidelines, the patient would not be due for surveillance colonoscopy until 2026, with a positive FOBT, a colonoscopy is indicated.  Will plan for diagnostic colonoscopy. ? ?Positive FOBT ?- Colonoscopy ? ?The details, risks (including bleeding, perforation, infection, missed lesions, medication reactions and possible hospitalization or surgery if complications occur), benefits, and alternatives to  colonoscopy with possible biopsy and possible polypectomy were discussed with the patient and he consents to proceed.  ? ?Lamichael Youkhana E. Candis Schatz, MD ?[facility] ? ? ?Vivi Barrack, MD ? ?

## 2021-04-25 NOTE — Patient Instructions (Signed)
If you are age 67 or older, your body mass index should be between 23-30. Your Body mass index is 33.97 kg/m?Marland Kitchen If this is out of the aforementioned range listed, please consider follow up with your Primary Care Provider. ? ?If you are age 39 or younger, your body mass index should be between 19-25. Your Body mass index is 33.97 kg/m?Marland Kitchen If this is out of the aformentioned range listed, please consider follow up with your Primary Care Provider.  ? ?You have been scheduled for a colonoscopy. Please follow written instructions given to you at your visit today.  ?Please pick up your prep supplies at the pharmacy within the next 1-3 days. ?If you use inhalers (even only as needed), please bring them with you on the day of your procedure. ? ? ?The [facility] providers would like to encourage you to use [facility] to communicate with providers for non-urgent requests or questions.  Due to long hold times on the telephone, sending your provider a message by [facility] may be a faster and more efficient way to get a response.  Please allow 48 business hours for a response.  Please remember that this is for non-urgent requests.  ? ?It was a pleasure to see you today! ? ?Thank you for trusting me with your gastrointestinal care!   ? ?Scott E.Candis Schatz, MD  ? ?

## 2021-05-02 ENCOUNTER — Encounter: Payer: Self-pay | Admitting: Emergency Medicine

## 2021-05-02 ENCOUNTER — Ambulatory Visit: Payer: Medicare HMO | Admitting: Emergency Medicine

## 2021-05-02 VITALS — BP 124/70 | HR 71 | Temp 98.3°F | Ht 69.0 in | Wt 227.4 lb

## 2021-05-02 DIAGNOSIS — Z87891 Personal history of nicotine dependence: Secondary | ICD-10-CM

## 2021-05-02 DIAGNOSIS — R918 Other nonspecific abnormal finding of lung field: Secondary | ICD-10-CM | POA: Diagnosis not present

## 2021-05-02 DIAGNOSIS — J449 Chronic obstructive pulmonary disease, unspecified: Secondary | ICD-10-CM

## 2021-05-02 NOTE — Progress Notes (Signed)
? ?Subjective:  ? ? Patient ID: Jacob Frost, male    DOB: 1955/10/28, 66 y.o.   MRN: [id number] ? ?HPI ?Follow-up visit for 66 year old former smoker with COPD, hypertension, hypercholesterolemia.  Last seen January 2022.  He has obstruction with a positive bronchodilator response. ?Currently using ? ?Participates in lung cancer screening program, has had some small calcified and noncalcified pulmonary nodules that have been followed.  Most recent CT 01/23/2021 as below. ? ?LDCT 01/23/2021 reviewed by me, shows no mediastinal or hilar adenopathy.  There is a 6.2 mm irregular medial right lower lobe pulmonary nodule that was 4 mm on his prior study that will need shorter interval follow-up.  No other changes in his chronic nodules. ? ? ?ROV 05/02/21 --Mr. Pavlovic is 74 and returns for follow-up.  He has a history of COPD with moderately severe obstruction and a positive bronchodilator response - he has stiolto but does not use it.  He is not on any scheduled bronchodilator therapy, uses albuterol less than once a week.  ?He had a lung cancer screening CT 01/23/2021 that identified a right lower lobe pulmonary nodule that had enlarged from 4 mm to 6 mm.  Based on this we repeated his CT at a shorter interval as below. ? ?CT chest 04/25/2021 reviewed by me shows advanced emphysematous changes.  The right lower lobe nodule in question is slightly smaller at 5 mm.  Other scattered nodules also noted, most stable but with a new right middle lobe nodule present, 6 mm. ? ? ?Review of Systems ?As per HPI ? ?Past Medical History:  ?Diagnosis Date  ? Aortic atherosclerosis ([place])   ? COPD (chronic obstructive pulmonary disease) ([place])   ? Emphysema of lung ([place])   ? Hyperlipidemia   ? Hypertension   ?  ? ?Family History  ?Problem Relation Age of Onset  ? Diabetes Father   ? Diabetes Sister   ? Hypertension Sister   ? Colon cancer Neg Hx   ? Stomach cancer Neg Hx   ? Esophageal cancer Neg Hx   ? Pancreatic cancer Neg Hx   ?   ? ?Social History  ? ?Socioeconomic History  ? Marital status: Single  ?  Spouse name: Not on file  ? Number of children: Not on file  ? Years of education: Not on file  ? Highest education level: Not on file  ?Occupational History  ? Not on file  ?Tobacco Use  ? Smoking status: Former  ?  Packs/day: 2.00  ?  Years: 37.00  ?  Pack years: 74.00  ?  Types: Cigarettes  ?  Start date: 07/16/1968  ?  Quit date: 07/16/2005  ?  Years since quitting: 15.8  ? Smokeless tobacco: Never  ?Substance and Sexual Activity  ? Alcohol use: Never  ? Drug use: Never  ? Sexual activity: Not on file  ?Other Topics Concern  ? Not on file  ?Social History Narrative  ? Not on file  ? ?Social Determinants of Health  ? ?Financial Resource Strain: Low Risk   ? Difficulty of Paying Living Expenses: Not hard at all  ?Food Insecurity: No Food Insecurity  ? Worried About Charity fundraiser in the Last Year: Never true  ? Ran Out of Food in the Last Year: Never true  ?Transportation Needs: No Transportation Needs  ? Lack of Transportation (Medical): No  ? Lack of Transportation (Non-Medical): No  ?Physical Activity: Inactive  ? Days of Exercise per Week: 0 days  ?  Minutes of Exercise per Session: 0 min  ?Stress: No Stress Concern Present  ? Feeling of Stress : Not at all  ?Social Connections: Unknown  ? Frequency of Communication with Friends and Family: More than three times a week  ? Frequency of Social Gatherings with Friends and Family: Once a week  ? Attends Religious Services: Never  ? Active Member of Clubs or Organizations: No  ? Attends Archivist Meetings: Never  ? Marital Status: Not on file  ?Intimate Partner Violence: Not At Risk  ? Fear of Current or Ex-Partner: No  ? Emotionally Abused: No  ? Physically Abused: No  ? Sexually Abused: No  ?  ? ?No Known Allergies  ? ?Outpatient Medications Prior to Visit  ?Medication Sig Dispense Refill  ? albuterol (PROAIR HFA) 108 (90 Base) MCG/ACT inhaler Inhale 2 puffs into the lungs  every 4 (four) hours as needed for wheezing or shortness of breath. 1 each 3  ? atorvastatin (LIPITOR) 40 MG tablet Take 1 tablet (40 mg total) by mouth daily. 90 tablet 3  ? clobetasol (OLUX) 0.05 % topical foam Apply topically 2 (two) times daily. 100 g 0  ? lisinopril (ZESTRIL) 5 MG tablet Take 1 tablet (5 mg total) by mouth daily. 90 tablet 0  ? sildenafil (VIAGRA) 100 MG tablet TAKE 1 TABLET BY MOUTH ONCE DAILY AS NEEDED 30 tablet 0  ? Tiotropium Bromide-Olodaterol (STIOLTO RESPIMAT) 2.5-2.5 MCG/ACT AERS Inhale 2 puffs into the lungs daily. 2 Inhaler 0  ? zolpidem (AMBIEN) 10 MG tablet Take 1 tablet (10 mg total) by mouth at bedtime as needed for sleep. 30 tablet 2  ? ?No facility-administered medications prior to visit.  ? ? ? ? ?   ?Objective:  ? Physical Exam ? ?Vitals:  ? 05/02/21 1002  ?BP: 124/70  ?Pulse: 71  ?Temp: 98.3 ?F (36.8 ?C)  ?TempSrc: Oral  ?SpO2: 97%  ?Weight: 227 lb 6.4 oz (103.1 kg)  ?Height: '5\' 9"'$  (1.753 m)  ? ?Gen: Pleasant, well-nourished, in no distress,  normal affect ? ?ENT: No lesions,  mouth clear,  oropharynx clear, no postnasal drip ? ?Neck: No JVD, no stridor ? ?Lungs: No use of accessory muscles, no crackles or wheezing on normal respiration, no wheeze on forced expiration ? ?Cardiovascular: RRR, heart sounds normal, no murmur or gallops, no peripheral edema ? ?Musculoskeletal: No deformities, no cyanosis or clubbing ? ?Neuro: alert, awake, non focal ? ?Skin: Warm, no lesions or rash ? ?   ?Assessment & Plan:  ? ?Pulmonary nodules ?The right lower lobe nodule in question that had enlarged on his last lung cancer screening CT is stable to smaller on this interval film.  There is a new right middle lobe nodule approximate 6 mm at the identified.  I think we can get him back into the lung cancer screening program, plan for his next repeat scan in 6 months. ? ?Chronic obstructive pulmonary disease ([place]) ?Obstruction noted on his pulmonary function testing but he has not reliably used  or needed bronchodilators.  We tried Stiolto in the past.  We could restart it if his breathing worsens.  He rarely uses albuterol. ? ? ?Baltazar Apo, MD, PhD ?05/02/2021, 10:17 AM ?[facility] ?[phone] or if no answer before 7:00PM call [phone] ?For any issues after 7:00PM please call eLink [phone] ? ?

## 2021-05-02 NOTE — Assessment & Plan Note (Signed)
The right lower lobe nodule in question that had enlarged on his last lung cancer screening CT is stable to smaller on this interval film.  There is a new right middle lobe nodule approximate 6 mm at the identified.  I think we can get him back into the lung cancer screening program, plan for his next repeat scan in 6 months. ?

## 2021-05-02 NOTE — Patient Instructions (Signed)
We reviewed your CT scan of the chest today. ?We will plan to repeat your lung cancer screening CT scan in late September 2023 (6 months) to follow small pulmonary nodules. ?Keep your albuterol available to use 2 puffs when you needed for shortness of breath, chest tightness, wheezing. ?If your breathing changes, worsens then we should consider getting you back on your Stiolto.  Please call and let us know if you think this would be beneficial. ?Follow Dr. Lamonte Sakai in 6 months to review your scan or sooner if you have any problems. ?

## 2021-05-02 NOTE — Assessment & Plan Note (Signed)
Obstruction noted on his pulmonary function testing but he has not reliably used or needed bronchodilators.  We tried Stiolto in the past.  We could restart it if his breathing worsens.  He rarely uses albuterol. ?

## 2021-05-11 ENCOUNTER — Other Ambulatory Visit: Payer: Self-pay | Admitting: Family Medicine

## 2021-05-12 ENCOUNTER — Encounter: Payer: Self-pay | Admitting: Family Medicine

## 2021-05-13 NOTE — Telephone Encounter (Signed)
Please advise 

## 2021-05-13 NOTE — Telephone Encounter (Signed)
Can we fix this in his chart? It looks like it was fixed in January but then got changed back.  It is possible this could be due to his pharmacy.  Can we also call him clarify with him as well.  He should only be on 5 mg once daily. ?

## 2021-05-15 ENCOUNTER — Other Ambulatory Visit: Payer: Self-pay | Admitting: *Deleted

## 2021-05-15 MED ORDER — LISINOPRIL 5 MG PO TABS: 5.0000 mg | ORAL_TABLET | Freq: Every day | ORAL | 0 refills | Status: DC

## 2021-05-15 NOTE — Telephone Encounter (Signed)
Rx verified by patient and change in chart  ?

## 2021-05-21 ENCOUNTER — Encounter: Payer: Self-pay | Admitting: Gastroenterology

## 2021-05-28 ENCOUNTER — Ambulatory Visit (AMBULATORY_SURGERY_CENTER): Payer: Medicare HMO | Admitting: Gastroenterology

## 2021-05-28 ENCOUNTER — Encounter: Payer: Self-pay | Admitting: Gastroenterology

## 2021-05-28 VITALS — BP 100/66 | HR 59 | Temp 98.6°F | Resp 10 | Ht 69.0 in | Wt 230.0 lb

## 2021-05-28 DIAGNOSIS — I10 Essential (primary) hypertension: Secondary | ICD-10-CM | POA: Diagnosis not present

## 2021-05-28 DIAGNOSIS — R195 Other fecal abnormalities: Secondary | ICD-10-CM

## 2021-05-28 DIAGNOSIS — J449 Chronic obstructive pulmonary disease, unspecified: Secondary | ICD-10-CM | POA: Diagnosis not present

## 2021-05-28 DIAGNOSIS — Z1211 Encounter for screening for malignant neoplasm of colon: Secondary | ICD-10-CM | POA: Diagnosis not present

## 2021-05-28 MED ORDER — SODIUM CHLORIDE 0.9 % IV SOLN: 500.0000 mL | Freq: Once | INTRAVENOUS | Status: DC

## 2021-05-28 NOTE — Op Note (Signed)
[place] ?Patient Name: Jacob Frost ?Procedure Date: 05/28/2021 2:04 PM ?MRN: [id number] ?Endoscopist: Mieke Brinley E. Candis Schatz , MD ?Age: 66 ?Referring MD:  ?Date of Birth: 1955-04-29 ?Gender: Male ?Account #: [id number] ?Procedure:                Colonoscopy ?Indications:              Positive fecal immunochemical test, history of  ?                          colon polyps ?Medicines:                Monitored Anesthesia Care ?Procedure:                Pre-Anesthesia Assessment: ?                          - Prior to the procedure, a History and Physical  ?                          was performed, and patient medications and  ?                          allergies were reviewed. The patient's tolerance of  ?                          previous anesthesia was also reviewed. The risks  ?                          and benefits of the procedure and the sedation  ?                          options and risks were discussed with the patient.  ?                          All questions were answered, and informed consent  ?                          was obtained. Prior Anticoagulants: The patient has  ?                          taken no previous anticoagulant or antiplatelet  ?                          agents except for aspirin. ASA Grade Assessment: II  ?                          - A patient with mild systemic disease. After  ?                          reviewing the risks and benefits, the patient was  ?                          deemed in satisfactory condition to undergo the  ?  procedure. ?                          After obtaining informed consent, the colonoscope  ?                          was passed under direct vision. Throughout the  ?                          procedure, the patient's blood pressure, pulse, and  ?                          oxygen saturations were monitored continuously. The  ?                          CF [id number] #[id number] was introduced through the anus  ?                           and advanced to the the terminal ileum, with  ?                          identification of the appendiceal orifice and IC  ?                          valve. The colonoscopy was somewhat difficult due  ?                          to significant looping. Successful completion of  ?                          the procedure was aided by using manual pressure.  ?                          The patient tolerated the procedure well. The  ?                          quality of the bowel preparation was adequate. The  ?                          terminal ileum, ileocecal valve, appendiceal  ?                          orifice, and rectum were photographed. The bowel  ?                          preparation used was Sutab via split dose  ?                          instruction. ?Scope In: 2:13:09 PM ?Scope Out: 2:38:30 PM ?Scope Withdrawal Time: 0 hours 17 minutes 55 seconds  ?Total Procedure Duration: 0 hours 25 minutes 21 seconds  ?Findings:                 Skin tags were found on perianal exam. ?  The digital rectal exam was normal. Pertinent  ?                          negatives include normal sphincter tone and no  ?                          palpable rectal lesions. ?                          Many small and large-mouthed diverticula were found  ?                          in the sigmoid colon and descending colon. ?                          The exam was otherwise normal throughout the  ?                          examined colon. ?                          The terminal ileum appeared normal. ?                          Non-bleeding internal hemorrhoids were found during  ?                          retroflexion. The hemorrhoids were Grade I  ?                          (internal hemorrhoids that do not prolapse). ?                          No additional abnormalities were found on  ?                          retroflexion. ?Complications:            No immediate complications. ?Estimated Blood Loss:     Estimated  blood loss: none. ?Impression:               - Perianal skin tags found on perianal exam. ?                          - Diverticulosis in the sigmoid colon and in the  ?                          descending colon. ?                          - The examined portion of the ileum was normal. ?                          - Non-bleeding internal hemorrhoids. ?                          - No specimens collected. ?Recommendation:           -  Patient has a contact number available for  ?                          emergencies. The signs and symptoms of potential  ?                          delayed complications were discussed with the  ?                          patient. Return to normal activities tomorrow.  ?                          Written discharge instructions were provided to the  ?                          patient. ?                          - Resume previous diet. ?                          - Continue present medications. ?                          - Repeat colonoscopy in 7 years for surveillance. ?Maryl Blalock E. Candis Schatz, MD ?05/28/2021 2:44:02 PM ?This report has been signed electronically. ?

## 2021-05-28 NOTE — Patient Instructions (Signed)
Discharge instructions given. °Handouts on Diverticulosis and Hemorrhoids. °Resume previous medications. °YOU HAD AN ENDOSCOPIC PROCEDURE TODAY AT THE [facility]:   Refer to the procedure report that was given to you for any specific questions about what was found during the examination.  If the procedure report does not answer your questions, please call your gastroenterologist to clarify.  If you requested that your care partner not be given the details of your procedure findings, then the procedure report has been included in a sealed envelope for you to review at your convenience later. ° °YOU SHOULD EXPECT: Some feelings of bloating in the abdomen. Passage of more gas than usual.  Walking can help get rid of the air that was put into your GI tract during the procedure and reduce the bloating. If you had a lower endoscopy (such as a colonoscopy or flexible sigmoidoscopy) you may notice spotting of blood in your stool or on the toilet paper. If you underwent a bowel prep for your procedure, you may not have a normal bowel movement for a few days. ° °Please Note:  You might notice some irritation and congestion in your nose or some drainage.  This is from the oxygen used during your procedure.  There is no need for concern and it should clear up in a day or so. ° °SYMPTOMS TO REPORT IMMEDIATELY: ° °Following lower endoscopy (colonoscopy or flexible sigmoidoscopy): ° Excessive amounts of blood in the stool ° Significant tenderness or worsening of abdominal pains ° Swelling of the abdomen that is new, acute ° Fever of 100°F or higher ° ° °For urgent or emergent issues, a gastroenterologist can be reached at any hour by calling [phone]. °Do not use MyChart messaging for urgent concerns.  ° ° °DIET:  We do recommend a small meal at first, but then you may proceed to your regular diet.  Drink plenty of fluids but you should avoid alcoholic beverages for 24 hours. ° °ACTIVITY:  You should plan to  take it easy for the rest of today and you should NOT DRIVE or use heavy machinery until tomorrow (because of the sedation medicines used during the test).   ° °FOLLOW UP: °Our staff will call the number listed on your records 48-72 hours following your procedure to check on you and address any questions or concerns that you may have regarding the information given to you following your procedure. If we do not reach you, we will leave a message.  We will attempt to reach you two times.  During this call, we will ask if you have developed any symptoms of COVID 19. If you develop any symptoms (ie: fever, flu-like symptoms, shortness of breath, cough etc.) before then, please call [phone].  If you test positive for Covid 19 in the 2 weeks post procedure, please call and report this information to us.   ° °If any biopsies were taken you will be contacted by phone or by letter within the next 1-3 weeks.  Please call us at [phone] if you have not heard about the biopsies in 3 weeks.  ° ° °SIGNATURES/CONFIDENTIALITY: °You and/or your care partner have signed paperwork which will be entered into your electronic medical record.  These signatures attest to the fact that that the information above on your After Visit Summary has been reviewed and is understood.  Full responsibility of the confidentiality of this discharge information lies with you and/or your care-partner.  °

## 2021-05-28 NOTE — Progress Notes (Signed)
Pt's states no medical or surgical changes since previsit or office visit. VS assessed by C.W 

## 2021-05-28 NOTE — Progress Notes (Signed)
Report to PACU, RN, vss, BBS= Clear.  

## 2021-05-28 NOTE — Progress Notes (Signed)
[facility] History and Physical ? ? ?Primary Care Physician:  Vivi Barrack, MD ? ? ?Reason for Procedure:   Positive FIT ? ?Plan:    Colonoscopy ? ? ? ? ?HPI: Jacob Frost is a 66 y.o. male undergoing colonoscopy due to positive FIT test.  His last colonoscopy was in 2019 and three small polyps were removed, 2 adenomas/1 hyperplastic.  He has no family history of colon cancer and no chronic GI symptoms.  ? ? ?Past Medical History:  ?Diagnosis Date  ? Aortic atherosclerosis ([place])   ? COPD (chronic obstructive pulmonary disease) ([place])   ? Emphysema of lung ([place])   ? Hyperlipidemia   ? Hypertension   ? ? ?Past Surgical History:  ?Procedure Laterality Date  ? TONSILLECTOMY    ? WISDOM TOOTH EXTRACTION    ? ? ?Prior to Admission medications   ?Medication Sig Start Date End Date Taking? Authorizing Provider  ?atorvastatin (LIPITOR) 40 MG tablet Take 1 tablet (40 mg total) by mouth daily. 02/10/21  Yes Vivi Barrack, MD  ?lisinopril (ZESTRIL) 5 MG tablet Take 1 tablet (5 mg total) by mouth daily. 05/15/21  Yes Vivi Barrack, MD  ?Multiple Vitamin (MULTIVITAMIN WITH MINERALS) TABS tablet Take 1 tablet by mouth daily.   Yes [provider]  ?Omega-3 Fatty Acids (FISH OIL) 1000 MG CAPS Take by mouth.   Yes [provider]  ?albuterol (PROAIR HFA) 108 (90 Base) MCG/ACT inhaler Inhale 2 puffs into the lungs every 4 (four) hours as needed for wheezing or shortness of breath. 02/04/21   Vivi Barrack, MD  ?clobetasol (OLUX) 0.05 % topical foam Apply topically 2 (two) times daily. ?Patient not taking: Reported on 05/28/2021 08/30/17   Shelda Pal, DO  ?sildenafil (VIAGRA) 100 MG tablet TAKE 1 TABLET BY MOUTH ONCE DAILY AS NEEDED ?Patient not taking: Reported on 05/28/2021 11/29/17   Shelda Pal, DO  ?Tiotropium Bromide-Olodaterol (STIOLTO RESPIMAT) 2.5-2.5 MCG/ACT AERS Inhale 2 puffs into the lungs daily. 02/01/18   Collene Gobble, MD  ?zolpidem (AMBIEN) 10 MG tablet Take 1  tablet (10 mg total) by mouth at bedtime as needed for sleep. ?Patient not taking: Reported on 05/28/2021 08/31/20   Vivi Barrack, MD  ? ? ?Current Outpatient Medications  ?Medication Sig Dispense Refill  ? atorvastatin (LIPITOR) 40 MG tablet Take 1 tablet (40 mg total) by mouth daily. 90 tablet 3  ? lisinopril (ZESTRIL) 5 MG tablet Take 1 tablet (5 mg total) by mouth daily. 90 tablet 0  ? Multiple Vitamin (MULTIVITAMIN WITH MINERALS) TABS tablet Take 1 tablet by mouth daily.    ? Omega-3 Fatty Acids (FISH OIL) 1000 MG CAPS Take by mouth.    ? albuterol (PROAIR HFA) 108 (90 Base) MCG/ACT inhaler Inhale 2 puffs into the lungs every 4 (four) hours as needed for wheezing or shortness of breath. 1 each 3  ? clobetasol (OLUX) 0.05 % topical foam Apply topically 2 (two) times daily. (Patient not taking: Reported on 05/28/2021) 100 g 0  ? sildenafil (VIAGRA) 100 MG tablet TAKE 1 TABLET BY MOUTH ONCE DAILY AS NEEDED (Patient not taking: Reported on 05/28/2021) 30 tablet 0  ? Tiotropium Bromide-Olodaterol (STIOLTO RESPIMAT) 2.5-2.5 MCG/ACT AERS Inhale 2 puffs into the lungs daily. 2 Inhaler 0  ? zolpidem (AMBIEN) 10 MG tablet Take 1 tablet (10 mg total) by mouth at bedtime as needed for sleep. (Patient not taking: Reported on 05/28/2021) 30 tablet 2  ? ?Current Facility-Administered Medications  ?  Medication Dose Route Frequency Provider Last Rate Last Admin  ? 0.9 %  sodium chloride infusion  500 mL Intravenous Once Daryel November, MD      ? ? ?Allergies as of 05/28/2021  ? (No Known Allergies)  ? ? ?Family History  ?Problem Relation Age of Onset  ? Diabetes Father   ? Diabetes Sister   ? Hypertension Sister   ? Colon cancer Neg Hx   ? Stomach cancer Neg Hx   ? Esophageal cancer Neg Hx   ? Pancreatic cancer Neg Hx   ? ? ?Social History  ? ?Socioeconomic History  ? Marital status: Single  ?  Spouse name: Not on file  ? Number of children: Not on file  ? Years of education: Not on file  ? Highest education level: Not on file   ?Occupational History  ? Not on file  ?Tobacco Use  ? Smoking status: Former  ?  Packs/day: 2.00  ?  Years: 37.00  ?  Pack years: 74.00  ?  Types: Cigarettes  ?  Start date: 07/16/1968  ?  Quit date: 07/16/2005  ?  Years since quitting: 15.8  ? Smokeless tobacco: Never  ?Substance and Sexual Activity  ? Alcohol use: Never  ? Drug use: Never  ? Sexual activity: Not on file  ?Other Topics Concern  ? Not on file  ?Social History Narrative  ? Not on file  ? ?Social Determinants of Health  ? ?Financial Resource Strain: Low Risk   ? Difficulty of Paying Living Expenses: Not hard at all  ?Food Insecurity: No Food Insecurity  ? Worried About Charity fundraiser in the Last Year: Never true  ? Ran Out of Food in the Last Year: Never true  ?Transportation Needs: No Transportation Needs  ? Lack of Transportation (Medical): No  ? Lack of Transportation (Non-Medical): No  ?Physical Activity: Inactive  ? Days of Exercise per Week: 0 days  ? Minutes of Exercise per Session: 0 min  ?Stress: No Stress Concern Present  ? Feeling of Stress : Not at all  ?Social Connections: Unknown  ? Frequency of Communication with Friends and Family: More than three times a week  ? Frequency of Social Gatherings with Friends and Family: Once a week  ? Attends Religious Services: Never  ? Active Member of Clubs or Organizations: No  ? Attends Archivist Meetings: Never  ? Marital Status: Not on file  ?Intimate Partner Violence: Not At Risk  ? Fear of Current or Ex-Partner: No  ? Emotionally Abused: No  ? Physically Abused: No  ? Sexually Abused: No  ? ? ?Review of Systems: ? ?All other review of systems negative except as mentioned in the HPI. ? ?Physical Exam: ?Vital signs ?BP 116/64   Pulse 62   Temp 98.6 ?F (37 ?C) (Skin)   Ht '5\' 9"'$  (1.753 m)   Wt 230 lb (104.3 kg)   SpO2 97%   BMI 33.97 kg/m?  ? ?General:   Alert,  Well-developed, well-nourished, pleasant and cooperative in NAD ?Airway:  Mallampati 3 ?Lungs:  Clear throughout to  auscultation.   ?Heart:  Regular rate and rhythm; no murmurs, clicks, rubs,  or gallops. ?Abdomen:  Soft, nontender and nondistended. Normal bowel sounds.   ?Neuro/Psych:  Normal mood and affect. A and O x 3 ? ? ?Natisha Trzcinski E. Candis Schatz, MD ?[facility] ? ?

## 2021-05-28 NOTE — Progress Notes (Signed)
Called to room to assist during endoscopic procedure.  Patient ID and intended procedure confirmed with present staff. Received instructions for my participation in the procedure from the performing physician.  

## 2021-05-30 ENCOUNTER — Telehealth: Payer: Self-pay | Admitting: *Deleted

## 2021-05-30 NOTE — Telephone Encounter (Signed)
?  Follow up Call- ? ? ?  05/28/2021  ? 12:54 PM  ?Call back number  ?Post procedure Call Back phone  # [phone]  ?Permission to leave phone message Yes  ?  ? ?Patient questions: ? ?Do you have a fever, pain , or abdominal swelling? No. ?Pain Score  0 * ? ?Have you tolerated food without any problems? Yes.   ? ?Have you been able to return to your normal activities? Yes.   ? ?Do you have any questions about your discharge instructions: ?Diet   No. ?Medications  No. ?Follow up visit  No. ? ?Do you have questions or concerns about your Care? No. ? ?Actions: ?* If pain score is 4 or above: ?No action needed, pain <4. ? ? ?

## 2021-08-05 ENCOUNTER — Encounter: Payer: Self-pay | Admitting: Family Medicine

## 2021-08-05 ENCOUNTER — Ambulatory Visit (INDEPENDENT_AMBULATORY_CARE_PROVIDER_SITE_OTHER): Payer: Medicare HMO | Admitting: Family Medicine

## 2021-08-05 VITALS — BP 120/82 | HR 67 | Temp 97.6°F | Ht 69.0 in | Wt 217.6 lb

## 2021-08-05 DIAGNOSIS — I10 Essential (primary) hypertension: Secondary | ICD-10-CM | POA: Diagnosis not present

## 2021-08-05 DIAGNOSIS — R739 Hyperglycemia, unspecified: Secondary | ICD-10-CM | POA: Diagnosis not present

## 2021-08-05 DIAGNOSIS — E785 Hyperlipidemia, unspecified: Secondary | ICD-10-CM

## 2021-08-05 DIAGNOSIS — L821 Other seborrheic keratosis: Secondary | ICD-10-CM | POA: Diagnosis not present

## 2021-08-05 LAB — POCT GLYCOSYLATED HEMOGLOBIN (HGB A1C): Hemoglobin A1C: 5.6 % (ref 4.0–5.6)

## 2021-08-05 LAB — COMPREHENSIVE METABOLIC PANEL
ALT: 45 U/L (ref 0–53)
AST: 28 U/L (ref 0–37)
Albumin: 4.5 g/dL (ref 3.5–5.2)
Alkaline Phosphatase: 122 U/L — ABNORMAL HIGH (ref 39–117)
BUN: 14 mg/dL (ref 6–23)
CO2: 28 mEq/L (ref 19–32)
Calcium: 10 mg/dL (ref 8.4–10.5)
Chloride: 104 mEq/L (ref 96–112)
Creatinine, Ser: 0.9 mg/dL (ref 0.40–1.50)
GFR: 88.78 mL/min (ref >60.00)
Glucose, Bld: 91 mg/dL (ref 70–99)
Potassium: 4.6 mEq/L (ref 3.5–5.1)
Sodium: 139 mEq/L (ref 135–145)
Total Bilirubin: 0.7 mg/dL (ref 0.2–1.2)
Total Protein: 7.1 g/dL (ref 6.0–8.3)

## 2021-08-05 LAB — LIPID PANEL
Cholesterol: 123 mg/dL (ref 0–200)
HDL: 31.2 mg/dL — ABNORMAL LOW (ref >39.00)
LDL Cholesterol: 73 mg/dL (ref 0–99)
NonHDL: 92.18
Total CHOL/HDL Ratio: 4
Triglycerides: 95 mg/dL (ref 0.0–149.0)
VLDL: 19 mg/dL (ref 0.0–40.0)

## 2021-08-05 LAB — CBC
HCT: 45 % (ref 39.0–52.0)
Hemoglobin: 15.2 g/dL (ref 13.0–17.0)
MCHC: 33.7 g/dL (ref 30.0–36.0)
MCV: 90.7 fl (ref 78.0–100.0)
Platelets: 198 10*3/uL (ref 150.0–400.0)
RBC: 4.96 Mil/uL (ref 4.22–5.81)
RDW: 13.3 % (ref 11.5–15.5)
WBC: 6.7 10*3/uL (ref 4.0–10.5)

## 2021-08-05 NOTE — Assessment & Plan Note (Signed)
A1c improved to 5.6.  He has done a great job with diet and exercise.  He is down about 13 pounds since his last visit.  Congratulated patient on weight loss.  We can recheck again in 6 months.

## 2021-08-05 NOTE — Assessment & Plan Note (Signed)
At goal lisinopril 5 mg daily.  Check labs.  Follow-up 6 months.

## 2021-08-05 NOTE — Progress Notes (Signed)
   Jacob Frost is a 66 y.o. male who presents today for an office visit.  Assessment/Plan:  Chronic Problems Addressed Today: Essential hypertension At goal lisinopril 5 mg daily.  Check labs.  Follow-up 6 months.  Hyperlipidemia Doing well with Lipitor 40 mg daily which we started about 6 months ago.  We will recheck lipids today.  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  Reassured patient.  We will continue with watchful waiting.  Hyperglycemia A1c improved to 5.6.  He has done a great job with diet and exercise.  He is down about 13 pounds since his last visit.  Congratulated patient on weight loss.  We can recheck again in 6 months.    Subjective:  HPI:  See A/P for status of chronic conditions.       Objective:  Physical Exam: BP 120/82   Pulse 67   Temp 97.6 F (36.4 C) (Temporal)   Ht '5\' 9"'$  (1.753 m)   Wt 217 lb 9.6 oz (98.7 kg)   SpO2 96%   BMI 32.13 kg/m   Wt Readings from Last 3 Encounters:  08/05/21 217 lb 9.6 oz (98.7 kg)  05/28/21 230 lb (104.3 kg)  05/02/21 227 lb 6.4 oz (103.1 kg)  Gen: No acute distress, resting comfortably Neuro: Grossly normal, moves all extremities Psych: Normal affect and thought content      Courtenay Hirth M. Jerline Pain, MD 08/05/2021 9:11 AM

## 2021-08-05 NOTE — Assessment & Plan Note (Signed)
Doing well with Lipitor 40 mg daily which we started about 6 months ago.  We will recheck lipids today.

## 2021-08-05 NOTE — Assessment & Plan Note (Signed)
No red flags.  Reassured patient.  We will continue with watchful waiting.

## 2021-08-05 NOTE — Patient Instructions (Signed)
It was very nice to see you today!  Keep up the great work!  Your A1c and blood pressure look great.  We will check your cholesterol levels today.  Please come back to see me in 6 months.  Come back sooner if needed.  Take care, Dr Jerline Pain  PLEASE NOTE:  If you had any lab tests please let us know if you have not heard back within a few days. You may see your results on mychart before we have a chance to review them but we will give you a call once they are reviewed by [place]. If we ordered any referrals today, please let us know if you have not heard from their office within the next week.   Please try these tips to maintain a healthy lifestyle:  Eat at least 3 REAL meals and 1-2 snacks per day.  Aim for no more than 5 hours between eating.  If you eat breakfast, please do so within one hour of getting up.   Each meal should contain half fruits/vegetables, one quarter protein, and one quarter carbs (no bigger than a computer mouse)  Cut down on sweet beverages. This includes juice, soda, and sweet tea.   Drink at least 1 glass of water with each meal and aim for at least 8 glasses per day  Exercise at least 150 minutes every week.

## 2021-08-08 NOTE — Progress Notes (Signed)
Please inform patient of the following:  Labs are all stable. Do not need to make any changes to treatment plan at this time. He should keep up the good work and we can recheck in 6-12 months.  Algis Greenhouse. Jerline Pain, MD 08/08/2021 10:23 AM

## 2021-08-13 ENCOUNTER — Other Ambulatory Visit: Payer: Self-pay | Admitting: *Deleted

## 2021-08-13 MED ORDER — LISINOPRIL 5 MG PO TABS: 5.0000 mg | ORAL_TABLET | Freq: Every day | ORAL | 0 refills | Status: DC

## 2021-08-21 ENCOUNTER — Other Ambulatory Visit: Payer: Self-pay | Admitting: *Deleted

## 2021-08-21 MED ORDER — ATORVASTATIN CALCIUM 40 MG PO TABS: 40.0000 mg | ORAL_TABLET | Freq: Every day | ORAL | 3 refills | Status: DC

## 2021-08-27 ENCOUNTER — Telehealth: Payer: Self-pay | Admitting: Emergency Medicine

## 2021-08-27 DIAGNOSIS — R918 Other nonspecific abnormal finding of lung field: Secondary | ICD-10-CM

## 2021-08-27 NOTE — Telephone Encounter (Signed)
New order placed per RB orders. Nothing further needed

## 2021-08-27 NOTE — Telephone Encounter (Signed)
Dr Lamonte Sakai,  Please note PCCs note on patient below:  [place] to schedule ct they cant do this lung screening due to patient Stopped 2007 from Smoking stated he does not qualifiy

## 2021-08-27 NOTE — Telephone Encounter (Signed)
Please change the order to get a SuperD Ct chest no contrast to be done 6 months after his most recent scan

## 2021-08-27 NOTE — Telephone Encounter (Signed)
Late September 2023

## 2021-08-29 ENCOUNTER — Telehealth: Payer: Self-pay | Admitting: Emergency Medicine

## 2021-08-29 NOTE — Telephone Encounter (Signed)
FYI to byrum

## 2021-09-01 NOTE — Telephone Encounter (Signed)
Thank you :)

## 2021-09-05 ENCOUNTER — Telehealth: Payer: Self-pay | Admitting: Family Medicine

## 2021-09-05 NOTE — Telephone Encounter (Signed)
Copied from [place] [id number]. Topic: Medicare AWV >> Sep 05, 2021  2:48 PM Devoria Glassing wrote: Reason for CRM: Left message 09/05/21  for patient to reschedule Annual Wellness Visit.  Please schedule with Nurse Health Advisor Charlott Rakes, RN at [facility]. This appt can be telephone or office visit. Please call [phone] ask for [facility]

## 2021-09-10 ENCOUNTER — Ambulatory Visit (INDEPENDENT_AMBULATORY_CARE_PROVIDER_SITE_OTHER): Payer: Medicare HMO | Admitting: Family Medicine

## 2021-09-10 ENCOUNTER — Encounter: Payer: Self-pay | Admitting: Family Medicine

## 2021-09-10 VITALS — BP 110/78 | HR 85 | Temp 97.9°F | Ht 69.0 in | Wt 219.2 lb

## 2021-09-10 DIAGNOSIS — R918 Other nonspecific abnormal finding of lung field: Secondary | ICD-10-CM | POA: Diagnosis not present

## 2021-09-10 DIAGNOSIS — I10 Essential (primary) hypertension: Secondary | ICD-10-CM

## 2021-09-10 DIAGNOSIS — J449 Chronic obstructive pulmonary disease, unspecified: Secondary | ICD-10-CM

## 2021-09-10 NOTE — Patient Instructions (Addendum)
It was very nice to see you today!  We will place a referral today.  We will see you back in January for your regular scheduled follow-up.  Please come back sooner if needed.  Take care, Dr Jerline Pain  PLEASE NOTE:  If you had any lab tests please let us know if you have not heard back within a few days. You may see your results on mychart before we have a chance to review them but we will give you a call once they are reviewed by [place]. If we ordered any referrals today, please let us know if you have not heard from their office within the next week.   Please try these tips to maintain a healthy lifestyle:  Eat at least 3 REAL meals and 1-2 snacks per day.  Aim for no more than 5 hours between eating.  If you eat breakfast, please do so within one hour of getting up.   Each meal should contain half fruits/vegetables, one quarter protein, and one quarter carbs (no bigger than a computer mouse)  Cut down on sweet beverages. This includes juice, soda, and sweet tea.   Drink at least 1 glass of water with each meal and aim for at least 8 glasses per day  Exercise at least 150 minutes every week.

## 2021-09-10 NOTE — Assessment & Plan Note (Signed)
Needs follow-up imaging.  We will defer further management to pulmonology.

## 2021-09-10 NOTE — Progress Notes (Signed)
   Jacob Frost is a 66 y.o. male who presents today for an office visit.  Assessment/Plan:  Chronic Problems Addressed Today: Chronic obstructive pulmonary disease ([place]) Symptoms are overall stable he would like to have a pulmonologist closer to home.  We will place referral today.  He is currently on albuterol as needed and stiolto.   Pulmonary nodules Needs follow-up imaging.  We will defer further management to pulmonology.  Essential hypertension At goal on lisinopril 5 mg daily.     Subjective:  HPI:  See A/p for status of chronic conditions.         Objective:  Physical Exam: BP 110/78   Pulse 85   Temp 97.9 F (36.6 C)   Ht '5\' 9"'$  (1.753 m)   Wt 219 lb 3.2 oz (99.4 kg)   SpO2 95%   BMI 32.37 kg/m   Gen: No acute distress, resting comfortably Neuro: Grossly normal, moves all extremities Psych: Normal affect and thought content      Khan Chura M. Jerline Pain, MD 09/10/2021 10:09 AM

## 2021-09-10 NOTE — Assessment & Plan Note (Signed)
At goal on lisinopril 5 mg daily.

## 2021-09-10 NOTE — Assessment & Plan Note (Signed)
Symptoms are overall stable he would like to have a pulmonologist closer to home.  We will place referral today.  He is currently on albuterol as needed and stiolto.

## 2021-09-12 ENCOUNTER — Ambulatory Visit: Payer: Medicare HMO

## 2021-09-30 ENCOUNTER — Telehealth: Payer: Self-pay

## 2021-09-30 ENCOUNTER — Ambulatory Visit: Payer: Medicare HMO

## 2021-09-30 NOTE — Telephone Encounter (Signed)
Phone number provided for calling patient for AWV is not currently in service. No answer and unable to leave message with phone number on account.

## 2021-10-09 DIAGNOSIS — Z9189 Other specified personal risk factors, not elsewhere classified: Secondary | ICD-10-CM | POA: Diagnosis not present

## 2021-10-09 DIAGNOSIS — I10 Essential (primary) hypertension: Secondary | ICD-10-CM | POA: Diagnosis not present

## 2021-10-09 DIAGNOSIS — R911 Solitary pulmonary nodule: Secondary | ICD-10-CM | POA: Diagnosis not present

## 2021-10-09 DIAGNOSIS — R0683 Snoring: Secondary | ICD-10-CM | POA: Diagnosis not present

## 2021-10-09 DIAGNOSIS — F5101 Primary insomnia: Secondary | ICD-10-CM | POA: Diagnosis not present

## 2021-10-09 DIAGNOSIS — J449 Chronic obstructive pulmonary disease, unspecified: Secondary | ICD-10-CM | POA: Diagnosis not present

## 2021-10-13 ENCOUNTER — Ambulatory Visit (HOSPITAL_BASED_OUTPATIENT_CLINIC_OR_DEPARTMENT_OTHER): Payer: Medicare HMO

## 2021-10-16 ENCOUNTER — Ambulatory Visit (INDEPENDENT_AMBULATORY_CARE_PROVIDER_SITE_OTHER): Payer: Medicare HMO

## 2021-10-16 DIAGNOSIS — Z23 Encounter for immunization: Secondary | ICD-10-CM

## 2021-10-17 DIAGNOSIS — J439 Emphysema, unspecified: Secondary | ICD-10-CM | POA: Diagnosis not present

## 2021-10-17 DIAGNOSIS — Z87891 Personal history of nicotine dependence: Secondary | ICD-10-CM | POA: Diagnosis not present

## 2021-10-17 DIAGNOSIS — R911 Solitary pulmonary nodule: Secondary | ICD-10-CM | POA: Diagnosis not present

## 2021-10-26 ENCOUNTER — Other Ambulatory Visit: Payer: Self-pay | Admitting: Family Medicine

## 2021-10-29 DIAGNOSIS — I2510 Atherosclerotic heart disease of native coronary artery without angina pectoris: Secondary | ICD-10-CM | POA: Diagnosis not present

## 2021-10-29 DIAGNOSIS — J432 Centrilobular emphysema: Secondary | ICD-10-CM | POA: Diagnosis not present

## 2021-10-29 DIAGNOSIS — J439 Emphysema, unspecified: Secondary | ICD-10-CM | POA: Diagnosis not present

## 2021-10-29 DIAGNOSIS — R918 Other nonspecific abnormal finding of lung field: Secondary | ICD-10-CM | POA: Diagnosis not present

## 2021-10-29 DIAGNOSIS — I700 Atherosclerosis of aorta: Secondary | ICD-10-CM | POA: Diagnosis not present

## 2021-10-29 DIAGNOSIS — Z122 Encounter for screening for malignant neoplasm of respiratory organs: Secondary | ICD-10-CM | POA: Diagnosis not present

## 2021-11-26 ENCOUNTER — Telehealth: Payer: Self-pay | Admitting: Family Medicine

## 2021-11-26 NOTE — Telephone Encounter (Signed)
Pt states: -Left handicap placard form 2 weeks ago at office.    Type of form received: [place] [employer] placard application  Additional comments:   Received by: Telephone   Is patient requesting call for pickup: yes  Form placed:   In provider's box  Attach charge sheet.  yes  Individual made aware of 3-5 business day turn around (Y/N)? yes

## 2021-11-27 ENCOUNTER — Telehealth: Payer: Self-pay | Admitting: Family Medicine

## 2021-11-27 NOTE — Telephone Encounter (Signed)
Pt states: -Time will be sending a form for PCP team to complete. -[employer] is a gym and they need clearance that patient can participate in exercise classes. -pt is currently planning to use the treadmill.   [place] at [facility] [phone]    Cannot confirm receipt of form at this time.

## 2021-11-27 NOTE — Telephone Encounter (Signed)
Form Ready to be pick up  Placed at front office  Patient notified

## 2021-11-28 NOTE — Telephone Encounter (Signed)
Form ready to be pick up  Copy placed to be scan at pt chart  Patient notified

## 2021-11-28 NOTE — Telephone Encounter (Signed)
Pt stated he picked up his handicap placard & it was only a temporary one for 6 months. He is asking if we can fill out another form to get the 5 yr one. Please advise.

## 2021-12-03 NOTE — Telephone Encounter (Signed)
Form faxed to [phone]

## 2021-12-03 NOTE — Telephone Encounter (Signed)
Form completed  Algis Greenhouse. Jerline Pain, MD 12/03/2021 10:22 AM

## 2021-12-10 ENCOUNTER — Other Ambulatory Visit: Payer: Self-pay | Admitting: *Deleted

## 2021-12-10 MED ORDER — ATORVASTATIN CALCIUM 40 MG PO TABS: 40.0000 mg | ORAL_TABLET | Freq: Every day | ORAL | 3 refills | Status: DC

## 2022-02-05 ENCOUNTER — Ambulatory Visit (INDEPENDENT_AMBULATORY_CARE_PROVIDER_SITE_OTHER): Payer: Medicare HMO | Admitting: Family Medicine

## 2022-02-05 ENCOUNTER — Encounter: Payer: Self-pay | Admitting: Family Medicine

## 2022-02-05 VITALS — BP 119/69 | HR 69 | Temp 97.5°F | Ht 69.0 in | Wt 230.0 lb

## 2022-02-05 DIAGNOSIS — I10 Essential (primary) hypertension: Secondary | ICD-10-CM | POA: Diagnosis not present

## 2022-02-05 DIAGNOSIS — J449 Chronic obstructive pulmonary disease, unspecified: Secondary | ICD-10-CM | POA: Diagnosis not present

## 2022-02-05 DIAGNOSIS — E785 Hyperlipidemia, unspecified: Secondary | ICD-10-CM | POA: Diagnosis not present

## 2022-02-05 DIAGNOSIS — G4700 Insomnia, unspecified: Secondary | ICD-10-CM

## 2022-02-05 DIAGNOSIS — R739 Hyperglycemia, unspecified: Secondary | ICD-10-CM

## 2022-02-05 LAB — COMPREHENSIVE METABOLIC PANEL WITH GFR
ALT: 56 U/L — ABNORMAL HIGH (ref 0–53)
AST: 29 U/L (ref 0–37)
Albumin: 4.5 g/dL (ref 3.5–5.2)
Alkaline Phosphatase: 133 U/L — ABNORMAL HIGH (ref 39–117)
BUN: 14 mg/dL (ref 6–23)
CO2: 31 meq/L (ref 19–32)
Calcium: 10.3 mg/dL (ref 8.4–10.5)
Chloride: 106 meq/L (ref 96–112)
Creatinine, Ser: 0.99 mg/dL (ref 0.40–1.50)
GFR: 78.9 mL/min (ref >60.00)
Glucose, Bld: 107 mg/dL — ABNORMAL HIGH (ref 70–99)
Potassium: 5.5 meq/L — ABNORMAL HIGH (ref 3.5–5.1)
Sodium: 145 meq/L (ref 135–145)
Total Bilirubin: 0.7 mg/dL (ref 0.2–1.2)
Total Protein: 7 g/dL (ref 6.0–8.3)

## 2022-02-05 LAB — CBC
HCT: 45.6 % (ref 39.0–52.0)
Hemoglobin: 15.4 g/dL (ref 13.0–17.0)
MCHC: 33.8 g/dL (ref 30.0–36.0)
MCV: 91.9 fl (ref 78.0–100.0)
Platelets: 214 K/uL (ref 150.0–400.0)
RBC: 4.96 Mil/uL (ref 4.22–5.81)
RDW: 13.4 % (ref 11.5–15.5)
WBC: 7.5 K/uL (ref 4.0–10.5)

## 2022-02-05 LAB — LIPID PANEL
Cholesterol: 136 mg/dL (ref 0–200)
HDL: 30.9 mg/dL — ABNORMAL LOW (ref >39.00)
LDL Cholesterol: 78 mg/dL (ref 0–99)
NonHDL: 105.01
Total CHOL/HDL Ratio: 4
Triglycerides: 134 mg/dL (ref 0.0–149.0)
VLDL: 26.8 mg/dL (ref 0.0–40.0)

## 2022-02-05 LAB — TSH: TSH: 1 u[IU]/mL (ref 0.35–5.50)

## 2022-02-05 LAB — HEMOGLOBIN A1C: Hgb A1c MFr Bld: 6 % (ref 4.6–6.5)

## 2022-02-05 NOTE — Assessment & Plan Note (Signed)
Check labs.  She is on Lipitor 40 mg daily.  Tolerating well.

## 2022-02-05 NOTE — Assessment & Plan Note (Signed)
Blood pressure at goal on lisinopril 5 mg daily.

## 2022-02-05 NOTE — Patient Instructions (Signed)
It was very nice to see you today!  I am glad that you are doing well.  No medication changes today.  Please continue to work on diet and exercise.  We will check blood work today.  Please come back to see me in 6 months.  Come back sooner if needed.  Take care, Dr Jerline Pain  PLEASE NOTE:  If you had any lab tests, please let us know if you have not heard back within a few days. You may see your results on mychart before we have a chance to review them but we will give you a call once they are reviewed by [place].   If we ordered any referrals today, please let us know if you have not heard from their office within the next week.   If you had any urgent prescriptions sent in today, please check with the pharmacy within an hour of our visit to make sure the prescription was transmitted appropriately.   Please try these tips to maintain a healthy lifestyle:  Eat at least 3 REAL meals and 1-2 snacks per day.  Aim for no more than 5 hours between eating.  If you eat breakfast, please do so within one hour of getting up.   Each meal should contain half fruits/vegetables, one quarter protein, and one quarter carbs (no bigger than a computer mouse)  Cut down on sweet beverages. This includes juice, soda, and sweet tea.   Drink at least 1 glass of water with each meal and aim for at least 8 glasses per day  Exercise at least 150 minutes every week.

## 2022-02-05 NOTE — Assessment & Plan Note (Signed)
Check A1c.  He is working on lifestyle modifications.

## 2022-02-05 NOTE — Assessment & Plan Note (Signed)
Symptoms overall stable.  On Stiolto and albuterol.  Will follow-up with pulmonology next month.

## 2022-02-05 NOTE — Progress Notes (Signed)
   Jacob Frost is a 67 y.o. male who presents today for an office visit.  Assessment/Plan:  Chronic Problems Addressed Today: Chronic obstructive pulmonary disease ([place]) Symptoms overall stable.  On Stiolto and albuterol.  Will follow-up with pulmonology next month.  Hyperlipidemia Check labs.  She is on Lipitor 40 mg daily.  Tolerating well.  Essential hypertension Blood pressure at goal on lisinopril 5 mg daily.  Insomnia Stable on Ambien as needed.  Does not need refill today.  No significant side effects.  Hyperglycemia Check A1c.  He is working on lifestyle modifications.    Subjective:  HPI:  Patient here today for follow-up.  See A/P for status of chronic conditions.  He has no acute concerns today.        Objective:  Physical Exam: BP 119/69   Pulse 69   Temp (!) 97.5 F (36.4 C) (Temporal)   Ht '5\' 9"'$  (1.753 m)   Wt 230 lb (104.3 kg)   SpO2 96%   BMI 33.97 kg/m   Gen: No acute distress, resting comfortably CV: Regular rate and rhythm with no murmurs appreciated Pulm: Normal work of breathing, clear to auscultation bilaterally with no crackles, wheezes, or rhonchi Neuro: Grossly normal, moves all extremities Psych: Normal affect and thought content      Jacob Frost M. Jerline Pain, MD 02/05/2022 8:25 AM

## 2022-02-05 NOTE — Assessment & Plan Note (Signed)
Stable on Ambien as needed.  Does not need refill today.  No significant side effects.

## 2022-02-10 NOTE — Progress Notes (Signed)
Please inform patient of the following:  He has elevation in potassium.  This is probably a lab error.  Recommend he come back to recheck.  Recommend he go to [facility] to have this redrawn.  His good cholesterol is a little bit low but his LDL is at goal.  His A1c is borderline elevated but stable compared to previous values.  All his other labs are stable.  Do not need to make any other adjustments to his treatment plan at this time.  He should continue to work on diet and exercise and we can recheck in 6 months.

## 2022-02-11 ENCOUNTER — Other Ambulatory Visit: Payer: Self-pay | Admitting: *Deleted

## 2022-02-11 DIAGNOSIS — E875 Hyperkalemia: Secondary | ICD-10-CM

## 2022-02-18 ENCOUNTER — Other Ambulatory Visit: Payer: Medicare HMO

## 2022-02-18 DIAGNOSIS — E875 Hyperkalemia: Secondary | ICD-10-CM | POA: Diagnosis not present

## 2022-02-19 ENCOUNTER — Telehealth: Payer: Self-pay | Admitting: Family Medicine

## 2022-02-19 LAB — COMPREHENSIVE METABOLIC PANEL
AG Ratio: 2 (calc) (ref 1.0–2.5)
ALT: 47 U/L — ABNORMAL HIGH (ref 9–46)
AST: 25 U/L (ref 10–35)
Albumin: 4.5 g/dL (ref 3.6–5.1)
Alkaline phosphatase (APISO): 132 U/L (ref 35–144)
BUN: 20 mg/dL (ref 7–25)
CO2: 24 mmol/L (ref 20–32)
Calcium: 9.7 mg/dL (ref 8.6–10.3)
Chloride: 108 mmol/L (ref 98–110)
Creat: 0.78 mg/dL (ref 0.70–1.35)
Globulin: 2.3 g/dL (calc) (ref 1.9–3.7)
Glucose, Bld: 93 mg/dL (ref 65–99)
Potassium: 4.6 mmol/L (ref 3.5–5.3)
Sodium: 141 mmol/L (ref 135–146)
Total Bilirubin: 0.6 mg/dL (ref 0.2–1.2)
Total Protein: 6.8 g/dL (ref 6.1–8.1)

## 2022-02-19 NOTE — Telephone Encounter (Signed)
Copied from [place] [id number]. Topic: Medicare AWV >> Feb 19, 2022 11:29 AM Gillis Santa wrote: Reason for CRM: LVM PATIENT TO CALL [phone] NEEDS AWVS [place]

## 2022-02-20 ENCOUNTER — Ambulatory Visit (INDEPENDENT_AMBULATORY_CARE_PROVIDER_SITE_OTHER): Payer: Medicare HMO | Admitting: Family Medicine

## 2022-02-20 ENCOUNTER — Encounter: Payer: Self-pay | Admitting: Family Medicine

## 2022-02-20 VITALS — BP 130/75 | HR 85 | Temp 97.1°F | Ht 69.0 in | Wt 229.0 lb

## 2022-02-20 DIAGNOSIS — I10 Essential (primary) hypertension: Secondary | ICD-10-CM | POA: Diagnosis not present

## 2022-02-20 DIAGNOSIS — I2584 Coronary atherosclerosis due to calcified coronary lesion: Secondary | ICD-10-CM

## 2022-02-20 DIAGNOSIS — I2510 Atherosclerotic heart disease of native coronary artery without angina pectoris: Secondary | ICD-10-CM

## 2022-02-20 DIAGNOSIS — E785 Hyperlipidemia, unspecified: Secondary | ICD-10-CM | POA: Diagnosis not present

## 2022-02-20 NOTE — Assessment & Plan Note (Signed)
Last LDL 78.  As above he is on atorvastatin 40 mg daily.  Would consider increasing dose of atorvastatin to lower LDL even further however he can discuss this with cardiology.

## 2022-02-20 NOTE — Assessment & Plan Note (Signed)
At goal today on lisinopril 5 mg daily.

## 2022-02-20 NOTE — Progress Notes (Signed)
Discussed with patient at his office visit today.  Potassium back to normal.  Do not need to do any further testing at this point.

## 2022-02-20 NOTE — Patient Instructions (Signed)
It was very nice to see you today!  We we will refer you to see the cardiologist.  We will see you back in July for your next visit.  Please come back to see [place] sooner if needed.  Take care, Dr Jerline Pain  PLEASE NOTE:  If you had any lab tests, please let us know if you have not heard back within a few days. You may see your results on mychart before we have a chance to review them but we will give you a call once they are reviewed by [place].   If we ordered any referrals today, please let us know if you have not heard from their office within the next week.   If you had any urgent prescriptions sent in today, please check with the pharmacy within an hour of our visit to make sure the prescription was transmitted appropriately.   Please try these tips to maintain a healthy lifestyle:  Eat at least 3 REAL meals and 1-2 snacks per day.  Aim for no more than 5 hours between eating.  If you eat breakfast, please do so within one hour of getting up.   Each meal should contain half fruits/vegetables, one quarter protein, and one quarter carbs (no bigger than a computer mouse)  Cut down on sweet beverages. This includes juice, soda, and sweet tea.   Drink at least 1 glass of water with each meal and aim for at least 8 glasses per day  Exercise at least 150 minutes every week.    Preventive Care 48 Years and Older, Male Preventive care refers to lifestyle choices and visits with your health care provider that can promote health and wellness. Preventive care visits are also called wellness exams. What can I expect for my preventive care visit? Counseling During your preventive care visit, your health care provider may ask about your: Medical history, including: Past medical problems. Family medical history. History of falls. Current health, including: Emotional well-being. Home life and relationship well-being. Sexual activity. Memory and ability to understand (cognition). Lifestyle,  including: Alcohol, nicotine or tobacco, and drug use. Access to firearms. Diet, exercise, and sleep habits. Work and work Statistician. Sunscreen use. Safety issues such as seatbelt and bike helmet use. Physical exam Your health care provider will check your: Height and weight. These may be used to calculate your BMI (body mass index). BMI is a measurement that tells if you are at a healthy weight. Waist circumference. This measures the distance around your waistline. This measurement also tells if you are at a healthy weight and may help predict your risk of certain diseases, such as type 2 diabetes and high blood pressure. Heart rate and blood pressure. Body temperature. Skin for abnormal spots. What immunizations do I need?  Vaccines are usually given at various ages, according to a schedule. Your health care provider will recommend vaccines for you based on your age, medical history, and lifestyle or other factors, such as travel or where you work. What tests do I need? Screening Your health care provider may recommend screening tests for certain conditions. This may include: Lipid and cholesterol levels. Diabetes screening. This is done by checking your blood sugar (glucose) after you have not eaten for a while (fasting). Hepatitis C test. Hepatitis B test. HIV (human immunodeficiency virus) test. STI (sexually transmitted infection) testing, if you are at risk. Lung cancer screening. Colorectal cancer screening. Prostate cancer screening. Abdominal aortic aneurysm (AAA) screening. You may need this if you are a  current or former smoker. Talk with your health care provider about your test results, treatment options, and if necessary, the need for more tests. Follow these instructions at home: Eating and drinking  Eat a diet that includes fresh fruits and vegetables, whole grains, lean protein, and low-fat dairy products. Limit your intake of foods with high amounts of sugar,  saturated fats, and salt. Take vitamin and mineral supplements as recommended by your health care provider. Do not drink alcohol if your health care provider tells you not to drink. If you drink alcohol: Limit how much you have to 0-2 drinks a day. Know how much alcohol is in your drink. In the [place] one drink equals one 12 oz bottle of beer (355 mL), one 5 oz glass of wine (148 mL), or one 1 oz glass of hard liquor (44 mL). Lifestyle Brush your teeth every morning and night with fluoride toothpaste. Floss one time each day. Exercise for at least 30 minutes 5 or more days each week. Do not use any products that contain nicotine or tobacco. These products include cigarettes, chewing tobacco, and vaping devices, such as e-cigarettes. If you need help quitting, ask your health care provider. Do not use drugs. If you are sexually active, practice safe sex. Use a condom or other form of protection to prevent STIs. Take aspirin only as told by your health care provider. Make sure that you understand how much to take and what form to take. Work with your health care provider to find out whether it is safe and beneficial for you to take aspirin daily. Ask your health care provider if you need to take a cholesterol-lowering medicine (statin). Find healthy ways to manage stress, such as: Meditation, yoga, or listening to music. Journaling. Talking to a trusted person. Spending time with friends and family. Safety Always wear your seat belt while driving or riding in a vehicle. Do not drive: If you have been drinking alcohol. Do not ride with someone who has been drinking. When you are tired or distracted. While texting. If you have been using any mind-altering substances or drugs. Wear a helmet and other protective equipment during sports activities. If you have firearms in your house, make sure you follow all gun safety procedures. Minimize exposure to UV radiation to reduce your risk of skin  cancer. What's next? Visit your health care provider once a year for an annual wellness visit. Ask your health care provider how often you should have your eyes and teeth checked. Stay up to date on all vaccines. This information is not intended to replace advice given to you by your health care provider. Make sure you discuss any questions you have with your health care provider. Document Revised: 07/10/2020 Document Reviewed: 07/10/2020 Elsevier Patient Education  [place].

## 2022-02-20 NOTE — Assessment & Plan Note (Signed)
Currently on Lipitor 40 mg daily.  He is tolerating this well.  Had a cardiac catheterization about a year and a half ago which showed nonobstructive CAD.  These results were in [place].  He would like to establish care with a cardiologist in the area.  Will place referral today.  We did discuss potentially increasing his Lipitor to 80 mg daily to reduce LDL however he can discuss this with cardiology.

## 2022-02-20 NOTE — Progress Notes (Signed)
   Jacob Frost is a 67 y.o. male who presents today for an office visit.  Assessment/Plan:  Chronic Problems Addressed Today: Coronary artery disease Currently on Lipitor 40 mg daily.  He is tolerating this well.  Had a cardiac catheterization about a year and a half ago which showed nonobstructive CAD.  These results were in [place].  He would like to establish care with a cardiologist in the area.  Will place referral today.  We did discuss potentially increasing his Lipitor to 80 mg daily to reduce LDL however he can discuss this with cardiology.  Hyperlipidemia Last LDL 78.  As above he is on atorvastatin 40 mg daily.  Would consider increasing dose of atorvastatin to lower LDL even further however he can discuss this with cardiology.  Essential hypertension At goal today on lisinopril 5 mg daily.     Subjective:  HPI:  See A/p for status of chronic conditions.    Patient here today for follow-up.  We did see him a few weeks ago.  Overall is doing well however he is concerned about possible coronary artery disease.  He has been getting routine lung cancer screenings via pulmonology.  Had a CT scan done about 3 months ago which showed three-vessel coronary artery disease.  He would like to see a cardiologist to discuss this further.  [facility] Frost.  Had cardiac catheterization at that time which showed nonobstructive coronary artery disease.  He is on atorvastatin 40 mg daily.  Tolerating well.  We did labs at his last office visit which showed high potassium.  He had this rechecked earlier this week.  Potassium levels had returned back to normal.       Objective:  Physical Exam: BP 130/75   Pulse 85   Temp (!) 97.1 F (36.2 C) (Temporal)   Ht '5\' 9"'$  (1.753 m)   Wt 229 lb (103.9 kg)   SpO2 95%   BMI 33.82 kg/m   Gen: No acute distress, resting comfortably Neuro: Grossly normal, moves all  extremities Psych: Normal affect and thought content      Jacob Hiltz M. Jerline Pain, MD 02/20/2022 11:35 AM

## 2022-02-25 ENCOUNTER — Ambulatory Visit: Payer: Medicare HMO | Admitting: Internal Medicine

## 2022-03-03 NOTE — Progress Notes (Signed)
Cardiology Office Note:   Date:  03/06/2022  NAME:  Jacob Frost    MRN: [id number] DOB:  1955-10-26   PCP:  Vivi Barrack, MD  Cardiologist:  Evalina Field, MD  Electrophysiologist:  None   Referring MD: Vivi Barrack, MD   Chief Complaint  Patient presents with   Coronary Artery Disease         History of Present Illness:   Jacob Frost is a 67 y.o. male with a hx of HTN, HLD, CAD who is being seen today for the evaluation of CAD at the request of Vivi Barrack, MD. underwent stress echocardiography in 2022 at [facility].  Was abnormal.  Left heart catheterization showed nonobstructive disease.  He wishes to know more about preventative measures.  Left heart catheterization at [place] showed nonobstructive CAD in 2022.  He reports he had abnormal stress test.  He denies any chest pain.  He does have COPD.  He can get short of breath with activity.  His most recent LDL cholesterol is 78.  We discussed increasing his Lipitor to 80.  His goal LDL cholesterol should be closer to 50.  He reports no chest pain.  His EKG shows normal sinus rhythm.  He has hypertension but his blood pressure is well-controlled.  He does have a strong family history of heart disease in his paternal grandfather.  He is not diabetic.  He has never had a heart attack or stroke.  He is a former smoker of 40 years.  He quit several years ago.  He is not married.  No children.  Not diabetic.  He used to work for city of [employer]  He is retired.  Not currently exercising.  Tells me his diet could be improved.  Working on both of these.  Problem List Non-obstructive CAD -LHC 07/09/2020 -> Mild CAD HLD -T chol 136, HDL 31, LDL 78, TG 134 HTN COPD  Past Medical History: Past Medical History:  Diagnosis Date   Aortic atherosclerosis (HCC)    COPD (chronic obstructive pulmonary disease) (HCC)    Emphysema of lung (HCC)    Hyperlipidemia     Hypertension     Past Surgical History: Past Surgical History:  Procedure Laterality Date   CARDIAC CATHETERIZATION     TONSILLECTOMY     WISDOM TOOTH EXTRACTION      Current Medications: Current Meds  Medication Sig   albuterol (PROAIR HFA) 108 (90 Base) MCG/ACT inhaler Inhale 2 puffs into the lungs every 4 (four) hours as needed for wheezing or shortness of breath.   clobetasol (OLUX) 0.05 % topical foam Apply topically 2 (two) times daily.   lisinopril (ZESTRIL) 5 MG tablet TAKE 1 TABLET EVERY DAY   Multiple Vitamin (MULTIVITAMIN WITH MINERALS) TABS tablet Take 1 tablet by mouth daily.   Omega-3 Fatty Acids (FISH OIL) 1000 MG CAPS Take by mouth.   sildenafil (VIAGRA) 100 MG tablet TAKE 1 TABLET BY MOUTH ONCE DAILY AS NEEDED   Tiotropium Bromide-Olodaterol (STIOLTO RESPIMAT) 2.5-2.5 MCG/ACT AERS Inhale 2 puffs into the lungs daily.   zolpidem (AMBIEN) 10 MG tablet Take 1 tablet (10 mg total) by mouth at bedtime as needed for sleep.   [DISCONTINUED] atorvastatin (LIPITOR) 40 MG tablet Take 1 tablet (40 mg total) by mouth daily.     Allergies:    Patient has no known allergies.   Social History: Social History   Socioeconomic History  Marital status: Single    Spouse name: Not on file   Number of children: Not on file   Years of education: Not on file   Highest education level: Not on file  Occupational History   Occupation: Retired - Sales executive  Tobacco Use   Smoking status: Former    Packs/day: 2.00    Years: 37.00    Total pack years: 74.00    Types: Cigarettes    Start date: 07/16/1968    Quit date: 07/16/2005    Years since quitting: 16.6   Smokeless tobacco: Never  Substance and Sexual Activity   Alcohol use: Never   Drug use: Never   Sexual activity: Not on file  Other Topics Concern   Not on file  Social History Narrative   Not on file   Social Determinants of Health   Financial Resource Strain: Low Risk  (09/06/2020)   Overall Financial  Resource Strain (CARDIA)    Difficulty of Paying Living Expenses: Not hard at all  Food Insecurity: No Food Insecurity (09/06/2020)   Hunger Vital Sign    Worried About Running Out of Food in the Last Year: Never true    [place] in the Last Year: Never true  Transportation Needs: No Transportation Needs (09/06/2020)   PRAPARE - Hydrologist (Medical): No    Lack of Transportation (Non-Medical): No  Physical Activity: Inactive (09/06/2020)   Exercise Vital Sign    Days of Exercise per Week: 0 days    Minutes of Exercise per Session: 0 min  Stress: No Stress Concern Present (09/06/2020)   [place]    Feeling of Stress : Not at all  Social Connections: Unknown (09/06/2020)   Social Connection and Isolation Panel [NHANES]    Frequency of Communication with Friends and Family: More than three times a week    Frequency of Social Gatherings with Friends and Family: Once a week    Attends Religious Services: Never    Marine scientist or Organizations: No    Attends Archivist Meetings: Never    Marital Status: Not on file     Family History: The patient's family history includes Diabetes in his father and sister; Heart attack in his paternal grandfather; Hypertension in his sister. There is no history of Colon cancer, Stomach cancer, Esophageal cancer, or Pancreatic cancer.  ROS:   All other ROS reviewed and negative. Pertinent positives noted in the HPI.     EKGs/Labs/Other Studies Reviewed:   The following studies were personally reviewed by me today:  EKG:  EKG is ordered today.  The ekg ordered today demonstrates normal sinus rhythm heart rate 77, no acute ischemic changes or evidence of infarction, and was personally reviewed by me.   LHC 07/09/2020 DIAGNOSTIC FINDINGS:    1.  Left dominant circulation.  2.  Mild, nonobstructive CAD   Recent Labs: 02/05/2022:  Hemoglobin 15.4; Platelets 214.0; TSH 1.00 02/18/2022: ALT 47; BUN 20; Creat 0.78; Potassium 4.6; Sodium 141   Recent Lipid Panel    Component Value Date/Time   CHOL 136 02/05/2022 0830   CHOL 132 05/23/2019 0811   TRIG 134.0 02/05/2022 0830   HDL 30.90 (L) 02/05/2022 0830   HDL 29 (L) 05/23/2019 0811   CHOLHDL 4 02/05/2022 0830   VLDL 26.8 02/05/2022 0830   LDLCALC 78 02/05/2022 0830   LDLCALC 79 05/23/2019 0811   LDLDIRECT 89.0  10/20/2017 0853    Physical Exam:   VS:  BP 116/68   Pulse 77   Ht 5' 9"$  (1.753 m)   Wt 226 lb 12.8 oz (102.9 kg)   SpO2 95%   BMI 33.49 kg/m    Wt Readings from Last 3 Encounters:  03/06/22 226 lb 12.8 oz (102.9 kg)  02/20/22 229 lb (103.9 kg)  02/05/22 230 lb (104.3 kg)    General: Well nourished, well developed, in no acute distress Head: Atraumatic, normal size  Eyes: PEERLA, EOMI  Neck: Supple, no JVD Endocrine: No thryomegaly Cardiac: Normal S1, S2; RRR; no murmurs, rubs, or gallops Lungs: Clear to auscultation bilaterally, no wheezing, rhonchi or rales  Abd: Soft, nontender, no hepatomegaly  Ext: No edema, pulses 2+ Musculoskeletal: No deformities, BUE and BLE strength normal and equal Skin: Warm and dry, no rashes   Neuro: Alert and oriented to person, place, time, and situation, CNII-XII grossly intact, no focal deficits  Psych: Normal mood and affect   ASSESSMENT:   DANFORD WORTHINGTON is a 67 y.o. male who presents for the following: 1. Coronary artery disease involving native coronary artery of native heart without angina pectoris   2. Mixed hyperlipidemia     PLAN:   1. Coronary artery disease involving native coronary artery of native heart without angina pectoris 2. Mixed hyperlipidemia -Nonobstructive CAD on left heart cath in 2022.  He is on aspirin 81 mg daily.  His LDL cholesterol should be closer to 50.  We will increase his Lipitor to 80 mg daily.  EKG is nonischemic.  He will come back in 2 months for fasting lipids.  His  echo showed normal LV [facility] through [facility].  No murmurs on examination.  I encouraged him to exercise and be active.  He should also work on his diet.  He will see [place] yearly.  Disposition: Return in about 1 year (around 03/07/2023).  Medication Adjustments/Labs and Tests Ordered: Current medicines are reviewed at length with the patient today.  Concerns regarding medicines are outlined above.  Orders Placed This Encounter  Procedures   Lipid panel   EKG 12-Lead   Meds ordered this encounter  Medications   atorvastatin (LIPITOR) 80 MG tablet    Sig: Take 1 tablet (80 mg total) by mouth daily.    Dispense:  90 tablet    Refill:  3    Patient Instructions  Medication Instructions:   INCREASE ATORVASTATIN TO 80 MG ONCE DAILY= 2 OF THE 40 MG TABLETS ONCE DAILY  *If you need a refill on your cardiac medications before your next appointment, please call your pharmacy*   Lab Work:  Your physician recommends that you return for lab work in:2 MONTHS-FASTING  If you have labs (blood work) drawn today and your tests are completely normal, you will receive your results only by: [place] (if you have MyChart) OR A paper copy in the mail If you have any lab test that is abnormal or we need to change your treatment, we will call you to review the results.   Follow-Up: At [facility], you and your health needs are our priority.  As part of our continuing mission to provide you with exceptional heart care, we have created designated Provider Care Teams.  These Care Teams include your primary Cardiologist (physician) and Advanced Practice Providers (APPs -  Physician Assistants and Nurse Practitioners) who all work together to provide you with the care you need, when you need  it.  We recommend signing up for the patient portal called "MyChart".  Sign up information is provided on this After Visit Summary.  MyChart is used to connect with patients for Virtual Visits  (Telemedicine).  Patients are able to view lab/test results, encounter notes, upcoming appointments, etc.  Non-urgent messages can be sent to your provider as well.   To learn more about what you can do with MyChart, go to [email or web address].    Your next appointment:   12 month(s)  Provider:   Eleonore Chiquito MD       Signed, Addison Naegeli. Audie Box, MD, [place] [facility]  [address], [place], [place] [address] [phone]  03/06/2022 3:03 PM

## 2022-03-04 ENCOUNTER — Other Ambulatory Visit (HOSPITAL_COMMUNITY): Payer: Medicare HMO

## 2022-03-06 ENCOUNTER — Encounter: Payer: Self-pay | Admitting: Cardiovascular Disease

## 2022-03-06 ENCOUNTER — Ambulatory Visit: Payer: Medicare HMO | Attending: Internal Medicine | Admitting: Cardiovascular Disease

## 2022-03-06 VITALS — BP 116/68 | HR 77 | Ht 69.0 in | Wt 226.8 lb

## 2022-03-06 DIAGNOSIS — E782 Mixed hyperlipidemia: Secondary | ICD-10-CM | POA: Diagnosis not present

## 2022-03-06 DIAGNOSIS — I2510 Atherosclerotic heart disease of native coronary artery without angina pectoris: Secondary | ICD-10-CM

## 2022-03-06 MED ORDER — ATORVASTATIN CALCIUM 80 MG PO TABS: 80.0000 mg | ORAL_TABLET | Freq: Every day | ORAL | 3 refills | Status: DC

## 2022-03-06 NOTE — Patient Instructions (Signed)
Medication Instructions:   INCREASE ATORVASTATIN TO 80 MG ONCE DAILY= 2 OF THE 40 MG TABLETS ONCE DAILY  *If you need a refill on your cardiac medications before your next appointment, please call your pharmacy*   Lab Work:  Your physician recommends that you return for lab work in:2 MONTHS-FASTING  If you have labs (blood work) drawn today and your tests are completely normal, you will receive your results only by: [place] (if you have MyChart) OR A paper copy in the mail If you have any lab test that is abnormal or we need to change your treatment, we will call you to review the results.   Follow-Up: At [facility], you and your health needs are our priority.  As part of our continuing mission to provide you with exceptional heart care, we have created designated Provider Care Teams.  These Care Teams include your primary Cardiologist (physician) and Advanced Practice Providers (APPs -  Physician Assistants and Nurse Practitioners) who all work together to provide you with the care you need, when you need it.  We recommend signing up for the patient portal called "MyChart".  Sign up information is provided on this After Visit Summary.  MyChart is used to connect with patients for Virtual Visits (Telemedicine).  Patients are able to view lab/test results, encounter notes, upcoming appointments, etc.  Non-urgent messages can be sent to your provider as well.   To learn more about what you can do with MyChart, go to [email or web address].    Your next appointment:   12 month(s)  Provider:   Eleonore Chiquito MD

## 2022-03-10 ENCOUNTER — Ambulatory Visit (INDEPENDENT_AMBULATORY_CARE_PROVIDER_SITE_OTHER): Payer: Medicare HMO

## 2022-03-10 VITALS — BP 120/78 | HR 95 | Temp 97.8°F | Wt 228.2 lb

## 2022-03-10 DIAGNOSIS — Z0000 Encounter for general adult medical examination without abnormal findings: Secondary | ICD-10-CM | POA: Diagnosis not present

## 2022-03-10 NOTE — Patient Instructions (Signed)
Jacob Frost , Thank you for taking time to come for your Medicare Wellness Visit. I appreciate your ongoing commitment to your health goals. Please review the following plan we discussed and let me know if I can assist you in the future.   These are the goals we discussed:  Goals      Patient Stated     None at this time         This is a list of the screening recommended for you and due dates:  Health Maintenance  Topic Date Due   DTaP/Tdap/Td vaccine (3 - Td or Tdap) 07/05/2018   Zoster (Shingles) Vaccine (2 of 2) 05/22/2022*   Medicare Annual Wellness Visit  03/11/2023   Colon Cancer Screening  05/28/2028   Pneumonia Vaccine  Completed   Flu Shot  Completed   Hepatitis C Screening: USPSTF Recommendation to screen - Ages 18-79 yo.  Completed   HPV Vaccine  Aged Out   COVID-19 Vaccine  Discontinued  *Topic was postponed. The date shown is not the original due date.    Advanced directives: Please bring a copy of your health care power of attorney and living will to the office at your convenience.  Conditions/risks identified: lose some weight   Next appointment: Follow up in one year for your annual wellness visit.   Preventive Care 47 Years and Older, Male  Preventive care refers to lifestyle choices and visits with your health care provider that can promote health and wellness. What does preventive care include? A yearly physical exam. This is also called an annual well check. Dental exams once or twice a year. Routine eye exams. Ask your health care provider how often you should have your eyes checked. Personal lifestyle choices, including: Daily care of your teeth and gums. Regular physical activity. Eating a healthy diet. Avoiding tobacco and drug use. Limiting alcohol use. Practicing safe sex. Taking low doses of aspirin every day. Taking vitamin and mineral supplements as recommended by your health care provider. What happens during an annual well check? The  services and screenings done by your health care provider during your annual well check will depend on your age, overall health, lifestyle risk factors, and family history of disease. Counseling  Your health care provider may ask you questions about your: Alcohol use. Tobacco use. Drug use. Emotional well-being. Home and relationship well-being. Sexual activity. Eating habits. History of falls. Memory and ability to understand (cognition). Work and work Statistician. Screening  You may have the following tests or measurements: Height, weight, and BMI. Blood pressure. Lipid and cholesterol levels. These may be checked every 5 years, or more frequently if you are over 46 years old. Skin check. Lung cancer screening. You may have this screening every year starting at age 28 if you have a 30-pack-year history of smoking and currently smoke or have quit within the past 15 years. Fecal occult blood test (FOBT) of the stool. You may have this test every year starting at age 61. Flexible sigmoidoscopy or colonoscopy. You may have a sigmoidoscopy every 5 years or a colonoscopy every 10 years starting at age 5. Prostate cancer screening. Recommendations will vary depending on your family history and other risks. Hepatitis C blood test. Hepatitis B blood test. Sexually transmitted disease (STD) testing. Diabetes screening. This is done by checking your blood sugar (glucose) after you have not eaten for a while (fasting). You may have this done every 1-3 years. Abdominal aortic aneurysm (AAA) screening. You may need  this if you are a current or former smoker. Osteoporosis. You may be screened starting at age 68 if you are at high risk. Talk with your health care provider about your test results, treatment options, and if necessary, the need for more tests. Vaccines  Your health care provider may recommend certain vaccines, such as: Influenza vaccine. This is recommended every year. Tetanus,  diphtheria, and acellular pertussis (Tdap, Td) vaccine. You may need a Td booster every 10 years. Zoster vaccine. You may need this after age 68. Pneumococcal 13-valent conjugate (PCV13) vaccine. One dose is recommended after age 68. Pneumococcal polysaccharide (PPSV23) vaccine. One dose is recommended after age 68. Talk to your health care provider about which screenings and vaccines you need and how often you need them. This information is not intended to replace advice given to you by your health care provider. Make sure you discuss any questions you have with your health care provider. Document Released: 02/08/2015 Document Revised: 10/02/2015 Document Reviewed: 11/13/2014 Elsevier Interactive Patient Education  2017 [place] Prevention in the Home Falls can cause injuries. They can happen to people of all ages. There are many things you can do to make your home safe and to help prevent falls. What can I do on the outside of my home? Regularly fix the edges of walkways and driveways and fix any cracks. Remove anything that might make you trip as you walk through a door, such as a raised step or threshold. Trim any bushes or trees on the path to your home. Use bright outdoor lighting. Clear any walking paths of anything that might make someone trip, such as rocks or tools. Regularly check to see if handrails are loose or broken. Make sure that both sides of any steps have handrails. Any raised decks and porches should have guardrails on the edges. Have any leaves, snow, or ice cleared regularly. Use sand or salt on walking paths during winter. Clean up any spills in your garage right away. This includes oil or grease spills. What can I do in the bathroom? Use night lights. Install grab bars by the toilet and in the tub and shower. Do not use towel bars as grab bars. Use non-skid mats or decals in the tub or shower. If you need to sit down in the shower, use a plastic,  non-slip stool. Keep the floor dry. Clean up any water that spills on the floor as soon as it happens. Remove soap buildup in the tub or shower regularly. Attach bath mats securely with double-sided non-slip rug tape. Do not have throw rugs and other things on the floor that can make you trip. What can I do in the bedroom? Use night lights. Make sure that you have a light by your bed that is easy to reach. Do not use any sheets or blankets that are too big for your bed. They should not hang down onto the floor. Have a firm chair that has side arms. You can use this for support while you get dressed. Do not have throw rugs and other things on the floor that can make you trip. What can I do in the kitchen? Clean up any spills right away. Avoid walking on wet floors. Keep items that you use a lot in easy-to-reach places. If you need to reach something above you, use a strong step stool that has a grab bar. Keep electrical cords out of the way. Do not use floor polish or wax that makes floors  slippery. If you must use wax, use non-skid floor wax. Do not have throw rugs and other things on the floor that can make you trip. What can I do with my stairs? Do not leave any items on the stairs. Make sure that there are handrails on both sides of the stairs and use them. Fix handrails that are broken or loose. Make sure that handrails are as long as the stairways. Check any carpeting to make sure that it is firmly attached to the stairs. Fix any carpet that is loose or worn. Avoid having throw rugs at the top or bottom of the stairs. If you do have throw rugs, attach them to the floor with carpet tape. Make sure that you have a light switch at the top of the stairs and the bottom of the stairs. If you do not have them, ask someone to add them for you. What else can I do to help prevent falls? Wear shoes that: Do not have high heels. Have rubber bottoms. Are comfortable and fit you well. Are closed  at the toe. Do not wear sandals. If you use a stepladder: Make sure that it is fully opened. Do not climb a closed stepladder. Make sure that both sides of the stepladder are locked into place. Ask someone to hold it for you, if possible. Clearly mark and make sure that you can see: Any grab bars or handrails. First and last steps. Where the edge of each step is. Use tools that help you move around (mobility aids) if they are needed. These include: Canes. Walkers. Scooters. Crutches. Turn on the lights when you go into a dark area. Replace any light bulbs as soon as they burn out. Set up your furniture so you have a clear path. Avoid moving your furniture around. If any of your floors are uneven, fix them. If there are any pets around you, be aware of where they are. Review your medicines with your doctor. Some medicines can make you feel dizzy. This can increase your chance of falling. Ask your doctor what other things that you can do to help prevent falls. This information is not intended to replace advice given to you by your health care provider. Make sure you discuss any questions you have with your health care provider. Document Released: 11/08/2008 Document Revised: 06/20/2015 Document Reviewed: 02/16/2014 Elsevier Interactive Patient Education  2017 [employer].

## 2022-03-10 NOTE — Progress Notes (Signed)
Subjective:   Jacob Frost is a 67 y.o. male who presents for Medicare Annual/Subsequent preventive examination.  Patient Medicare AWV questionnaire was completed by the patient on 03/10/22; I have confirmed that all information answered by patient is correct and no changes since this date.          Review of Systems     Cardiac Risk Factors include: advanced age (>44mn, >>66women);obesity (BMI >30kg/m2);male gender;hypertension;dyslipidemia     Objective:    Today's Vitals   03/10/22 1033  BP: 120/78  Pulse: 95  Temp: 97.8 F (36.6 C)  SpO2: 95%  Weight: 228 lb 3.2 oz (103.5 kg)   Body mass index is 33.7 kg/m.     03/10/2022   10:41 AM 09/06/2020    2:22 PM  Advanced Directives  Does Patient Have a Medical Advance Directive? Yes No  Type of [place]   Copy of [place] Chart? No - copy requested   Would patient like information on creating a medical advance directive?  No - Patient declined    Current Medications (verified) Outpatient Encounter Medications as of 03/10/2022  Medication Sig   albuterol (PROAIR HFA) 108 (90 Base) MCG/ACT inhaler Inhale 2 puffs into the lungs every 4 (four) hours as needed for wheezing or shortness of breath.   atorvastatin (LIPITOR) 80 MG tablet Take 1 tablet (80 mg total) by mouth daily.   b complex vitamins capsule Take 1 capsule by mouth daily.   clobetasol (OLUX) 0.05 % topical foam Apply topically 2 (two) times daily.   lisinopril (ZESTRIL) 5 MG tablet TAKE 1 TABLET EVERY DAY   Multiple Vitamin (MULTIVITAMIN WITH MINERALS) TABS tablet Take 1 tablet by mouth daily.   Omega-3 Fatty Acids (FISH OIL) 1000 MG CAPS Take by mouth.   SHINGRIX injection    sildenafil (VIAGRA) 100 MG tablet TAKE 1 TABLET BY MOUTH ONCE DAILY AS NEEDED   Tiotropium Bromide-Olodaterol (STIOLTO RESPIMAT) 2.5-2.5 MCG/ACT AERS Inhale 2 puffs into the lungs daily.   VITAMIN D PO Take by  mouth.   zolpidem (AMBIEN) 10 MG tablet Take 1 tablet (10 mg total) by mouth at bedtime as needed for sleep.   No facility-administered encounter medications on file as of 03/10/2022.    Allergies (verified) Patient has no known allergies.   History: Past Medical History:  Diagnosis Date   Aortic atherosclerosis (HCC)    COPD (chronic obstructive pulmonary disease) (HCC)    Emphysema of lung (HCC)    Hyperlipidemia    Hypertension    Past Surgical History:  Procedure Laterality Date   CARDIAC CATHETERIZATION     TONSILLECTOMY     WISDOM TOOTH EXTRACTION     Family History  Problem Relation Age of Onset   Diabetes Father    Diabetes Sister    Hypertension Sister    Heart attack Paternal Grandfather    Colon cancer Neg Hx    Stomach cancer Neg Hx    Esophageal cancer Neg Hx    Pancreatic cancer Neg Hx    Social History   Socioeconomic History   Marital status: Single    Spouse name: Not on file   Number of children: Not on file   Years of education: Not on file   Highest education level: Not on file  Occupational History   Occupation: Retired -Equities trader Tobacco Use   Smoking status: Former    Packs/day: 2.00  Years: 37.00    Total pack years: 74.00    Types: Cigarettes    Start date: 07/16/1968    Quit date: 07/16/2005    Years since quitting: 16.6   Smokeless tobacco: Never  Substance and Sexual Activity   Alcohol use: Never   Drug use: Never   Sexual activity: Not on file  Other Topics Concern   Not on file  Social History Narrative   Not on file   Social Determinants of Health   Financial Resource Strain: Low Risk  (03/10/2022)   Overall Financial Resource Strain (CARDIA)    Difficulty of Paying Living Expenses: Not hard at all  Food Insecurity: No Food Insecurity (03/10/2022)   Hunger Vital Sign    Worried About Running Out of Food in the Last Year: Never true    Ran Out of Food in the Last Year: Never true  Transportation Needs:  No Transportation Needs (03/10/2022)   PRAPARE - Hydrologist (Medical): No    Lack of Transportation (Non-Medical): No  Physical Activity: Inactive (09/06/2020)   Exercise Vital Sign    Days of Exercise per Week: 0 days    Minutes of Exercise per Session: 0 min  Stress: No Stress Concern Present (03/10/2022)   [place]    Feeling of Stress : Not at all  Social Connections: Socially Isolated (03/10/2022)   Social Connection and Isolation Panel [NHANES]    Frequency of Communication with Friends and Family: Once a week    Frequency of Social Gatherings with Friends and Family: More than three times a week    Attends Religious Services: Never    Marine scientist or Organizations: No    Attends Music therapist: Never    Marital Status: Never married    Tobacco Counseling Counseling given: Not Answered   Clinical Intake:  Pre-visit preparation completed: Yes  Pain : No/denies pain     Diabetes: No  How often do you need to have someone help you when you read instructions, pamphlets, or other written materials from your doctor or pharmacy?: 1 - Never  Diabetic?no  Interpreter Needed?: No  Information entered by :: Charlott Rakes, LPN   Activities of Daily Living    03/10/2022    9:31 AM  In your present state of health, do you have any difficulty performing the following activities:  Hearing? 0  Vision? 0  Difficulty concentrating or making decisions? 0  Walking or climbing stairs? 0  Dressing or bathing? 0  Doing errands, shopping? 0  Preparing Food and eating ? N  Using the Toilet? N  In the past six months, have you accidently leaked urine? N  Do you have problems with loss of bowel control? N  Managing your Medications? N  Managing your Finances? N  Housekeeping or managing your Housekeeping? N    Patient Care Team: Vivi Barrack, MD as PCP -  General (Family Medicine) O'Neal, Cassie Freer, MD as PCP - Cardiology (Cardiology)  Indicate any recent Medical Services you may have received from other than [facility] providers in the past year (date may be approximate).     Assessment:   This is a routine wellness examination for Jacob Frost.  Hearing/Vision screen Hearing Screening - Comments:: Pt denies any hearing issues  Vision Screening - Comments:: Encouraged to follow up   Dietary issues and exercise activities discussed: Current Exercise Habits: The patient does not participate  in regular exercise at present   Goals Addressed             This Visit's Progress    Patient Stated       Lose some weight        Depression Screen    03/10/2022   10:42 AM 02/20/2022   11:05 AM 02/20/2022   11:01 AM 02/05/2022    7:50 AM 09/10/2021    9:45 AM 08/05/2021    8:32 AM 09/06/2020    2:21 PM  PHQ 2/9 Scores  PHQ - 2 Score 0 0 0 0 0 0 0    Fall Risk    03/10/2022    9:31 AM 02/20/2022   11:05 AM 02/20/2022   11:01 AM 02/05/2022    7:50 AM 08/05/2021    8:32 AM  Fall Risk   Falls in the past year? 0 0 0 0 0  Number falls in past yr: 0 0 0 0 0  Injury with Fall? 0 0 0 0 0  Risk for fall due to :  No Fall Risks No Fall Risks No Fall Risks No Fall Risks  Follow up Falls prevention discussed        FALL RISK PREVENTION PERTAINING TO THE HOME:  Any stairs in or around the home? No  If so, are there any without handrails? No  Home free of loose throw rugs in walkways, pet beds, electrical cords, etc? Yes  Adequate lighting in your home to reduce risk of falls? Yes   ASSISTIVE DEVICES UTILIZED TO PREVENT FALLS:  Life alert? No  Use of a cane, walker or w/c? No  Grab bars in the bathroom? yes Shower chair or bench in shower? No  Elevated toilet seat or a handicapped toilet? No   TIMED UP AND GO:  Was the test performed? Yes .  Length of time to ambulate 10 feet: 10 sec.   Gait steady and fast without use of assistive  device  Cognitive Function:        03/10/2022   10:45 AM 09/06/2020    2:25 PM  6CIT Screen  What Year? 0 points 0 points  What month? 0 points 0 points  What time? 0 points 0 points  Count back from 20 0 points 0 points  Months in reverse 0 points 2 points  Repeat phrase 0 points 2 points  Total Score 0 points 4 points    Immunizations Immunization History  Administered Date(s) Administered   Fluad Quad(high Dose 65+) 10/03/2020, 10/16/2021   Influenza Whole 12/11/2008, 12/10/2009   Influenza, High Dose Seasonal PF 11/18/2019   Influenza,inj,Quad PF,6+ Mos 11/04/2015, 10/20/2017, 10/24/2018   Influenza-Unspecified 11/26/2016, 10/20/2017, 11/19/2019   PFIZER(Purple Top)SARS-COV-2 Vaccination 04/01/2019, 04/21/2019   Pneumococcal Conjugate-13 12/02/2017   Pneumococcal Polysaccharide-23 02/08/2009, 02/22/2020   Td 07/04/2008   Tdap 07/04/2008   Zoster Recombinat (Shingrix) 02/07/2022    TDAP status: Due, Education has been provided regarding the importance of this vaccine. Advised may receive this vaccine at local pharmacy or Health Dept. Aware to provide a copy of the vaccination record if obtained from local pharmacy or Health Dept. Verbalized acceptance and understanding.  Flu Vaccine status: Up to date  Pneumococcal vaccine status: Up to date  Covid-19 vaccine status: Completed vaccines  Qualifies for Shingles Vaccine? Yes   Zostavax completed Yes   Shingrix Completed?: No.    Education has been provided regarding the importance of this vaccine. Patient has been advised to call insurance company to  determine out of pocket expense if they have not yet received this vaccine. Advised may also receive vaccine at local pharmacy or Health Dept. Verbalized acceptance and understanding.  Screening Tests Health Maintenance  Topic Date Due   DTaP/Tdap/Td (3 - Td or Tdap) 07/05/2018   Zoster Vaccines- Shingrix (2 of 2) 05/22/2022 (Originally 04/04/2022)   Medicare Annual  Wellness (AWV)  03/11/2023   COLONOSCOPY (Pts 45-56yr Insurance coverage will need to be confirmed)  05/28/2028   Pneumonia Vaccine 68 Years old  Completed   INFLUENZA VACCINE  Completed   Hepatitis C Screening  Completed   HPV VACCINES  Aged Out   COVID-19 Vaccine  Discontinued    Health Maintenance  Health Maintenance Due  Topic Date Due   DTaP/Tdap/Td (3 - Td or Tdap) 07/05/2018    Colorectal cancer screening: Type of screening: Colonoscopy. Completed 05/28/21. Repeat every 7 years    Additional Screening:  Hepatitis C Screening:  Completed 10/20/17  Vision Screening: Recommended annual ophthalmology exams for early detection of glaucoma and other disorders of the eye. Is the patient up to date with their annual eye exam?  No  Who is the provider or what is the name of the office in which the patient attends annual eye exams? Encouraged to follow up  If pt is not established with a provider, would they like to be referred to a provider to establish care? No .   Dental Screening: Recommended annual dental exams for proper oral hygiene  Community Resource Referral / Chronic Care Management: CRR required this visit?  No   CCM required this visit?  No      Plan:     I have personally reviewed and noted the following in the patient's chart:   Medical and social history Use of alcohol, tobacco or illicit drugs  Current medications and supplements including opioid prescriptions. Patient is not currently taking opioid prescriptions. Functional ability and status Nutritional status Physical activity Advanced directives List of other physicians Hospitalizations, surgeries, and ER visits in previous 12 months Vitals Screenings to include cognitive, depression, and falls Referrals and appointments  In addition, I have reviewed and discussed with patient certain preventive protocols, quality metrics, and best practice recommendations. A written personalized care plan for  preventive services as well as general preventive health recommendations were provided to patient.     TWillette Brace LPN   [id number]  Nurse Notes: none

## 2022-03-18 DIAGNOSIS — J432 Centrilobular emphysema: Secondary | ICD-10-CM | POA: Diagnosis not present

## 2022-03-18 DIAGNOSIS — Z87891 Personal history of nicotine dependence: Secondary | ICD-10-CM | POA: Diagnosis not present

## 2022-03-18 DIAGNOSIS — Z23 Encounter for immunization: Secondary | ICD-10-CM | POA: Diagnosis not present

## 2022-03-18 DIAGNOSIS — R911 Solitary pulmonary nodule: Secondary | ICD-10-CM | POA: Diagnosis not present

## 2022-04-27 ENCOUNTER — Telehealth: Payer: Self-pay | Admitting: Family Medicine

## 2022-04-27 NOTE — Telephone Encounter (Signed)
Type of form received:Letter for [employer]  Additional comments:   Received KQ:[id number]   Form should be Faxed to:  Form should be mailed to:    Is patient requesting call for pickup: Yes    Form placed:  providers box  Attach charge sheet. Yes   Individual made aware of 3-5 business day turn around (Y/N)? Yes

## 2022-05-01 ENCOUNTER — Encounter: Payer: Self-pay | Admitting: Family Medicine

## 2022-05-14 DIAGNOSIS — E782 Mixed hyperlipidemia: Secondary | ICD-10-CM | POA: Diagnosis not present

## 2022-05-15 LAB — LIPID PANEL
Chol/HDL Ratio: 4.3 ratio (ref 0.0–5.0)
Cholesterol, Total: 133 mg/dL (ref 100–199)
HDL: 31 mg/dL — ABNORMAL LOW (ref >39)
LDL Chol Calc (NIH): 82 mg/dL (ref 0–99)
Triglycerides: 110 mg/dL (ref 0–149)
VLDL Cholesterol Cal: 20 mg/dL (ref 5–40)

## 2022-05-20 ENCOUNTER — Telehealth: Payer: Self-pay | Admitting: Cardiovascular Disease

## 2022-05-20 DIAGNOSIS — E782 Mixed hyperlipidemia: Secondary | ICD-10-CM

## 2022-05-20 MED ORDER — EZETIMIBE 10 MG PO TABS: 10.0000 mg | ORAL_TABLET | Freq: Every day | ORAL | 3 refills | Status: DC

## 2022-05-20 NOTE — Telephone Encounter (Signed)
Patient returned RN's call and stated medication can be sent to [id number] - [place], [place] - [address]

## 2022-05-20 NOTE — Telephone Encounter (Signed)
Zetia  sent to patient preferred pharmacy. Repeat lipids ordered. Left message for patient stating that labs ordered and medication sent to pharmacy and to call back for concerns.   Sande Rives, MD 05/17/2022  5:59 PM EDT     LDL still not at goal. Add zetia 10 mg daily. Have him come back in 6 months to recheck lipids.   Gerri Spore T. Flora Lipps, MD, [facility]  [facility] [address], [address] [place], [place] [address] [phone] 5:57 PM

## 2022-08-06 ENCOUNTER — Ambulatory Visit (INDEPENDENT_AMBULATORY_CARE_PROVIDER_SITE_OTHER): Payer: Medicare HMO | Admitting: Family Medicine

## 2022-08-06 VITALS — BP 110/70 | HR 63 | Temp 97.8°F | Resp 16 | Wt 225.0 lb

## 2022-08-06 DIAGNOSIS — J449 Chronic obstructive pulmonary disease, unspecified: Secondary | ICD-10-CM

## 2022-08-06 DIAGNOSIS — E785 Hyperlipidemia, unspecified: Secondary | ICD-10-CM | POA: Diagnosis not present

## 2022-08-06 DIAGNOSIS — I10 Essential (primary) hypertension: Secondary | ICD-10-CM | POA: Diagnosis not present

## 2022-08-06 DIAGNOSIS — R739 Hyperglycemia, unspecified: Secondary | ICD-10-CM

## 2022-08-06 LAB — COMPREHENSIVE METABOLIC PANEL
ALT: 47 U/L (ref 0–53)
AST: 25 U/L (ref 0–37)
Albumin: 4.5 g/dL (ref 3.5–5.2)
Alkaline Phosphatase: 132 U/L — ABNORMAL HIGH (ref 39–117)
BUN: 13 mg/dL (ref 6–23)
CO2: 26 mEq/L (ref 19–32)
Calcium: 10 mg/dL (ref 8.4–10.5)
Chloride: 105 mEq/L (ref 96–112)
Creatinine, Ser: 0.8 mg/dL (ref 0.40–1.50)
GFR: 91.35 mL/min (ref >60.00)
Glucose, Bld: 97 mg/dL (ref 70–99)
Potassium: 4.3 mEq/L (ref 3.5–5.1)
Sodium: 139 mEq/L (ref 135–145)
Total Bilirubin: 0.8 mg/dL (ref 0.2–1.2)
Total Protein: 6.9 g/dL (ref 6.0–8.3)

## 2022-08-06 LAB — LIPID PANEL
Cholesterol: 126 mg/dL (ref 0–200)
HDL: 26.6 mg/dL — ABNORMAL LOW (ref >39.00)
LDL Cholesterol: 72 mg/dL (ref 0–99)
NonHDL: 99.05
Total CHOL/HDL Ratio: 5
Triglycerides: 135 mg/dL (ref 0.0–149.0)
VLDL: 27 mg/dL (ref 0.0–40.0)

## 2022-08-06 LAB — TSH: TSH: 0.97 u[IU]/mL (ref 0.35–5.50)

## 2022-08-06 LAB — CBC
HCT: 46.1 % (ref 39.0–52.0)
Hemoglobin: 15.2 g/dL (ref 13.0–17.0)
MCHC: 33.1 g/dL (ref 30.0–36.0)
MCV: 91.6 fl (ref 78.0–100.0)
Platelets: 206 10*3/uL (ref 150.0–400.0)
RBC: 5.03 Mil/uL (ref 4.22–5.81)
RDW: 13.8 % (ref 11.5–15.5)
WBC: 7.9 10*3/uL (ref 4.0–10.5)

## 2022-08-06 LAB — HEMOGLOBIN A1C: Hgb A1c MFr Bld: 5.9 % (ref 4.6–6.5)

## 2022-08-06 NOTE — Progress Notes (Signed)
   Jacob Frost is a 67 y.o. male who presents today for an office visit.  Assessment/Plan:  New/Acute Problems: Folliculitis  No red flags.  Can use topical antibiotic ointment if needed.  He will let us know if not improving.  Chronic Problems Addressed Today: Hyperlipidemia Following with cardiology for this.  They recently increased dose of Lipitor to 80 mg daily and started Zetia 10 mg daily.  They would like for his LDL to be closer to 50.  Will recheck labs today.  Discussed lifestyle modifications.  Essential hypertension Blood pressure at goal today on lisinopril 10 mg daily.  Hyperglycemia Check A1c.  Discussed lifestyle modifications.  Chronic obstructive pulmonary disease (HCC) Follows with pulmonology.  Recently received Prevnar 20.  He is on stiletto and albuterol.     Subjective:  HPI:  See A/P for status of chronic conditions.  Patient is here today for 83-month follow-up.  At his last office visit we had referred him to see cardiology.  His Lipitor dose was increased to 80 mg daily.  Repeat lipids were not at goal and he had Zetia added onto his regimen.  He noticed a lump on the back of his neck about a week ago. Some pain to the area.  Improving the last day or so.       Objective:  Physical Exam: BP 110/70 (BP Location: Right Arm, Patient Position: Sitting, Cuff Size: Large)   Pulse 63   Temp 97.8 F (36.6 C) (Oral)   Resp 16   Wt 225 lb (102.1 kg)   SpO2 95%   BMI 33.23 kg/m   Wt Readings from Last 3 Encounters:  08/06/22 225 lb (102.1 kg)  03/10/22 228 lb 3.2 oz (103.5 kg)  03/06/22 226 lb 12.8 oz (102.9 kg)    Gen: No acute distress, resting comfortably CV: Regular rate and rhythm with no murmurs appreciated Pulm: Normal work of breathing, clear to auscultation bilaterally with no crackles, wheezes, or rhonchi Skin: Inflamed hair follicle on left posterior neck.  No signs of cellulitis Neuro: Grossly normal, moves all extremities Psych:  Normal affect and thought content      Jacob Frost M. Jimmey Ralph, MD 08/06/2022 8:47 AM

## 2022-08-06 NOTE — Assessment & Plan Note (Signed)
Following with cardiology for this.  They recently increased dose of Lipitor to 80 mg daily and started Zetia 10 mg daily.  They would like for his LDL to be closer to 50.  Will recheck labs today.  Discussed lifestyle modifications.

## 2022-08-06 NOTE — Assessment & Plan Note (Signed)
Check A1c.  Discussed lifestyle modifications. °

## 2022-08-06 NOTE — Assessment & Plan Note (Signed)
Follows with pulmonology.  Recently received Prevnar 20.  He is on stiletto and albuterol.

## 2022-08-06 NOTE — Patient Instructions (Signed)
It was very nice to see you today!  We will check blood work today.  The spot on your neck is an inflamed hair follicle.  You can put a little Neosporin on the area if needed.  Let us know if not improving.  Return in about 6 months (around 02/06/2023) for Annual Physical.   Take care, Dr Jimmey Ralph  PLEASE NOTE:  If you had any lab tests, please let us know if you have not heard back within a few days. You may see your results on mychart before we have a chance to review them but we will give you a call once they are reviewed by [place].   If we ordered any referrals today, please let us know if you have not heard from their office within the next week.   If you had any urgent prescriptions sent in today, please check with the pharmacy within an hour of our visit to make sure the prescription was transmitted appropriately.   Please try these tips to maintain a healthy lifestyle:  Eat at least 3 REAL meals and 1-2 snacks per day.  Aim for no more than 5 hours between eating.  If you eat breakfast, please do so within one hour of getting up.   Each meal should contain half fruits/vegetables, one quarter protein, and one quarter carbs (no bigger than a computer mouse)  Cut down on sweet beverages. This includes juice, soda, and sweet tea.   Drink at least 1 glass of water with each meal and aim for at least 8 glasses per day  Exercise at least 150 minutes every week.

## 2022-08-06 NOTE — Assessment & Plan Note (Signed)
Blood pressure at goal today on lisinopril 10 mg daily. 

## 2022-08-07 NOTE — Progress Notes (Signed)
His HDL is a bit low and his blood sugar is borderline elevated.  Overall labs are stable compared to last time.  Do not need to make any adjustments to treatment plan.  He should continue to work on diet and exercise and we can recheck everything in 6 months.

## 2022-08-26 ENCOUNTER — Other Ambulatory Visit: Payer: Self-pay | Admitting: Family Medicine

## 2022-08-26 NOTE — Telephone Encounter (Signed)
Prescription Request  08/26/2022  LOV: 08/06/2022  What is the name of the medication or equipment?  zolpidem (AMBIEN) 10 MG tablet   lisinopril (ZESTRIL) 5 MG tablet    Have you contacted your pharmacy to request a refill? No   Which pharmacy would you like this sent to? [id number] - [place], [place] - [address] [place] [address] Phone: [phone] Fax: [phone]   Patient notified that their request is being sent to the clinical staff for review and that they should receive a response within 2 business days.   Please advise at Mobile [phone] (mobile)

## 2022-08-28 ENCOUNTER — Other Ambulatory Visit: Payer: Self-pay | Admitting: Family Medicine

## 2022-08-28 MED ORDER — ZOLPIDEM TARTRATE 10 MG PO TABS: 10.0000 mg | ORAL_TABLET | Freq: Every evening | ORAL | 2 refills | Status: DC | PRN

## 2022-08-29 ENCOUNTER — Other Ambulatory Visit: Payer: Self-pay | Admitting: Family Medicine

## 2022-09-07 ENCOUNTER — Ambulatory Visit (INDEPENDENT_AMBULATORY_CARE_PROVIDER_SITE_OTHER): Payer: Medicare PPO | Admitting: Family Medicine

## 2022-09-07 ENCOUNTER — Encounter: Payer: Self-pay | Admitting: Family Medicine

## 2022-09-07 VITALS — BP 106/70 | HR 73 | Temp 97.3°F | Ht 69.0 in | Wt 222.6 lb

## 2022-09-07 DIAGNOSIS — M545 Low back pain, unspecified: Secondary | ICD-10-CM | POA: Diagnosis not present

## 2022-09-07 DIAGNOSIS — I10 Essential (primary) hypertension: Secondary | ICD-10-CM

## 2022-09-07 DIAGNOSIS — R739 Hyperglycemia, unspecified: Secondary | ICD-10-CM

## 2022-09-07 MED ORDER — PREDNISONE 50 MG PO TABS: ORAL_TABLET | ORAL | 0 refills | Status: DC

## 2022-09-07 MED ORDER — BACLOFEN 10 MG PO TABS: 10.0000 mg | ORAL_TABLET | Freq: Three times a day (TID) | ORAL | 0 refills | Status: DC

## 2022-09-07 MED ORDER — LISINOPRIL 5 MG PO TABS: 5.0000 mg | ORAL_TABLET | Freq: Every day | ORAL | Status: DC

## 2022-09-07 NOTE — Assessment & Plan Note (Signed)
Last A1c 5.9-she do not have any issues with prednisone burst.

## 2022-09-07 NOTE — Patient Instructions (Signed)
It was very nice to see you today!  You had a spasm in your low back.  Please start the muscle relaxer and prednisone.  Work on exercises.  You can also use a heating pad.  Let us know if not improving in the next 1 to 2 weeks.  Return if symptoms worsen or fail to improve.   Take care, Dr Jimmey Ralph  PLEASE NOTE:  If you had any lab tests, please let us know if you have not heard back within a few days. You may see your results on mychart before we have a chance to review them but we will give you a call once they are reviewed by [place].   If we ordered any referrals today, please let us know if you have not heard from their office within the next week.   If you had any urgent prescriptions sent in today, please check with the pharmacy within an hour of our visit to make sure the prescription was transmitted appropriately.   Please try these tips to maintain a healthy lifestyle:  Eat at least 3 REAL meals and 1-2 snacks per day.  Aim for no more than 5 hours between eating.  If you eat breakfast, please do so within one hour of getting up.   Each meal should contain half fruits/vegetables, one quarter protein, and one quarter carbs (no bigger than a computer mouse)  Cut down on sweet beverages. This includes juice, soda, and sweet tea.   Drink at least 1 glass of water with each meal and aim for at least 8 glasses per day  Exercise at least 150 minutes every week.

## 2022-09-07 NOTE — Assessment & Plan Note (Signed)
At goal on lisinopril 10 mg daily. 

## 2022-09-07 NOTE — Progress Notes (Signed)
   Jacob Frost is a 67 y.o. male who presents today for an office visit.  Assessment/Plan:  New/Acute Problems: Low back pain No red flags.  Consistent with muscular strain.  Having some mild sciatica symptoms as well.  We will start baclofen.  Also start prednisone.  Will avoid NSAIDs due to CAD.  We discussed home exercises and handout was given.  He can also use heating pad.  He will let us know if not improving in the next 1 to 2 weeks and we can refer to PT or sports medicine at that time.  Chronic Problems Addressed Today: Essential hypertension At goal on lisinopril 10 mg daily.  Hyperglycemia Last A1c 5.9-she do not have any issues with prednisone burst.     Subjective:  HPI:  See Assessment / plan for status of chronic conditions.  Patient is here today with hip and back pain.  Started a couple weeks ago.  Pain has started out mild but does seem to be getting worse. Located in right lower back and radiates into buttocks and groin. No injuries or precipitating events. Hurts all the time. Tried bengay which has not helped. No reported bowel or bladder incontinence. No urinary retention.        Objective:  Physical Exam: BP 106/70   Pulse 73   Temp (!) 97.3 F (36.3 C) (Temporal)   Ht 5\' 9"  (1.753 m)   Wt 222 lb 9.6 oz (101 kg)   SpO2 95%   BMI 32.87 kg/m   Gen: No acute distress, resting comfortably MUSCULOSKELETAL: - Back: No deformities.  Tender palpation along right lower lumbar area.  Several tender connective tissue nodules noted.  Neuro vastly intact distally Neuro: Grossly normal, moves all extremities Psych: Normal affect and thought content      Kaseem Vastine M. Jimmey Ralph, MD 09/07/2022 9:06 AM

## 2022-09-16 ENCOUNTER — Telehealth: Payer: Self-pay | Admitting: Family Medicine

## 2022-09-16 NOTE — Telephone Encounter (Signed)
Please advise 

## 2022-09-16 NOTE — Telephone Encounter (Signed)
Pt states he was to inform pcp as to whether he was getting any better.He states he has 1 Baclofen left & is still in great pain. Is there another rx that he can try? Please advise.

## 2022-09-17 ENCOUNTER — Other Ambulatory Visit: Payer: Self-pay | Admitting: *Deleted

## 2022-09-17 DIAGNOSIS — M545 Low back pain, unspecified: Secondary | ICD-10-CM

## 2022-09-17 MED ORDER — BACLOFEN 10 MG PO TABS: 10.0000 mg | ORAL_TABLET | Freq: Three times a day (TID) | ORAL | 0 refills | Status: DC

## 2022-09-17 NOTE — Telephone Encounter (Signed)
Spoke with patient referral and medication send

## 2022-09-17 NOTE — Telephone Encounter (Signed)
We can refill his baclofen. Recommend referral to physical therapy or sports medicine if symptoms are not improving.  Katina Degree. Jimmey Ralph, MD 09/17/2022 7:29 AM

## 2022-09-18 ENCOUNTER — Other Ambulatory Visit: Payer: Self-pay | Admitting: Family Medicine

## 2022-09-18 NOTE — Telephone Encounter (Signed)
Pt needs RX  baclofen (LIORESAL) 10 MG tablet  To be sent to  [id number] - [place], [place] - [address] Phone: [phone] Fax: [phone]  He needs it RX before the weekend

## 2022-09-21 NOTE — Telephone Encounter (Signed)
Rx was sent today to the pharmacy.

## 2022-09-22 ENCOUNTER — Encounter: Payer: Self-pay | Admitting: Physical Therapy

## 2022-09-22 ENCOUNTER — Ambulatory Visit: Payer: Medicare PPO | Admitting: Physical Therapy

## 2022-09-22 DIAGNOSIS — M5459 Other low back pain: Secondary | ICD-10-CM

## 2022-09-22 NOTE — Therapy (Signed)
OUTPATIENT PHYSICAL THERAPY LOWER EXTREMITY EVALUATION   Patient Name: Jacob Frost MRN: [id number] DOB:[birth year], 67 y.o., male Today's Date: 09/22/2022  END OF SESSION:  PT End of Session - 09/22/22 1116     Visit Number 1    Number of Visits 16    Date for PT Re-Evaluation 11/17/22    Authorization Type [employer]    PT Start Time 0925    PT Stop Time 1010    PT Time Calculation (min) 45 min    Activity Tolerance Patient tolerated treatment well;Patient limited by pain    Behavior During Therapy [facility] for tasks assessed/performed             Past Medical History:  Diagnosis Date   Aortic atherosclerosis (HCC)    COPD (chronic obstructive pulmonary disease) (HCC)    Emphysema of lung (HCC)    Hyperlipidemia    Hypertension    Past Surgical History:  Procedure Laterality Date   CARDIAC CATHETERIZATION     TONSILLECTOMY     WISDOM TOOTH EXTRACTION     Patient Active Problem List   Diagnosis Date Noted   Coronary artery disease 02/20/2022   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   Hyperglycemia 02/04/2021   Pulmonary nodules 01/31/2021   Insomnia 02/22/2020   Psoriasis 02/22/2020   Aortic atherosclerosis (HCC)    History of smoking 30 or more pack years 10/20/2017   ERECTILE DYSFUNCTION, 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    SUBJECTIVE:   SUBJECTIVE STATEMENT: Pt reports Newer onset of R low back pain, about 2-3 weeks ago. No injury to report, no previous pain.  Wasn't that bad to start, but then worsened. Pain radiating into R LE/anterior thigh and into groin. No pain past  knee.  Using cane now because of back pain, was not using prior.   Pt is retired, likes to be active at home.  Most pain:  sitting starts pain, and it eases a bit if he gets up and walks around. Also first thing in am.  R side pain, none on L.  Did have round of prednisone, did not get any relief. Baclofen: does not seem to be  helping , but he does feel pain is maybe a bit better in last couple days.  Not doing any exercises for back or regular exercise in general , just active around/outside of house.  Stairs: no stairs.  Heating pad: worse.   PERTINENT HISTORY: COPD, HTN, emphysema,  PAIN:  Are you having pain? Yes: NPRS scale: 2- 5 /10 Pain location: R low back Pain description: shooting, radiating Aggravating factors: sitting or standing too long, transfers.  Relieving factors: changing positions.   PRECAUTIONS: None  WEIGHT BEARING RESTRICTIONS: No  FALLS:  Has patient fallen in last 6 months? No  PLOF: Independent  PATIENT GOALS:  Decreased pain   NEXT MD VISIT:   OBJECTIVE:   DIAGNOSTIC FINDINGS:   PATIENT SURVEYS:   FOTO:  eval:  39   COGNITION: Overall cognitive status: Within functional limits for tasks assessed     SENSATION: WFL  EDEMA:   POSTURE:  Slight lateral shift to L to offload R.   PALPATION:  mild tenderness in low lumbar region, Less pain centrally, most pain in R glute  LOWER EXTREMITY ROM: Lumbar: flexion: mild/mod limitation,  extension: mod limitation with pain.  SB: mod limitation with inc pain to the R.  Hips: WFL Knees: WFL   LOWER EXTREMITY MMT:  MMT Left eval Right  eval  Hip flexion 4 4  Hip extension    Hip abduction  4-  pain  Hip adduction    Hip internal rotation    Hip external rotation  4-  pain  Knee flexion    Knee extension    Ankle dorsiflexion    Ankle plantarflexion    Ankle inversion    Ankle eversion     (Blank rows = not tested)  LOWER EXTREMITY SPECIAL TESTS:  + SLR on R;  Able to  tolerate prone on 1 pillow- but still with mild soreness. Increased pain with flat prone laying Comfortable in supine, hooklying position   FUNCTIONAL TESTS:    GAIT: Distance walked: 100 ft  Assistive device utilized: Single point cane Level of assistance: Modified independence Comments: antalgic gait    TODAY'S TREATMENT:                                                                                                                              DATE:   09/22/2022  Therapeutic Exercise: Aerobic: Supine:  education on hooklying/decompression position ,  pelvic tilts x 15;  Piriformis stretch 30 sec x 2 bil;  Trial for prone laying- able to tolerate laying on 1 pillow, still sore, but better than laying flat/prone.  Seated: Standing: trial for R side glides- painful.  Stretches:  Neuromuscular Re-education: Manual Therapy: long leg distraction on R for lumbar  Therapeutic Activity: Self Care:   PATIENT EDUCATION:  Education details: PT POC, Exam findings, HEP Person educated: Patient Education method: Explanation, Demonstration, Tactile cues, Verbal cues, and Handouts Education comprehension: verbalized understanding, returned demonstration, verbal cues required, tactile cues required, and needs further education  HOME EXERCISE PROGRAM: Access Code: [email or web address] URL: [email or web address] Date: 09/22/2022 Prepared by: Sedalia Muta  Exercises - Supine Posterior Pelvic Tilt  - 2 x daily - 2 sets - 10 reps - Supine Piriformis Stretch Pulling Heel to Hip  - 2 x daily - 3 reps - 30 hold  ASSESSMENT:  CLINICAL IMPRESSION: Patient presents with primary complaint of pain in low back, radiating into R LE. Pt with symptoms of lumbar radiculopathy. He has Slight shift away from R in standing, due to increased pain with loading to R. He has antalgic gait and decreased tolerance for walking due to pain, and requires use of SPC at this time. He has poor movement quality  , with decreased ROM and pain.  Pt with decreased ability for full functional activities, ADLS, and IADLs.  Pt will  benefit from skilled PT to improve deficits and pain and  to return to PLOF.   OBJECTIVE IMPAIRMENTS: Abnormal gait, decreased activity tolerance, decreased mobility, decreased ROM, decreased strength, increased muscle spasms, and pain.   ACTIVITY LIMITATIONS: lifting, bending, sitting, standing, squatting, sleeping, transfers, bed mobility, dressing, reach over head, hygiene/grooming, and locomotion level  PARTICIPATION LIMITATIONS: meal prep, cleaning, laundry, driving, shopping, community activity, and yard work  PERSONAL FACTORS:  none  are also affecting patient's functional outcome.   REHAB POTENTIAL: Good  CLINICAL DECISION MAKING: Stable/uncomplicated  EVALUATION COMPLEXITY: Low   GOALS: Goals reviewed with patient? Yes  SHORT TERM GOALS: Target date: 10/06/2022   Pt to be independent with initial HEP  Goal status: INITIAL  2.  Pt to report decreased pain in back and leg to 0-4/10   Goal status: INITIAL   LONG TERM GOALS: Target date: 11/17/2022  Pt to be independent with final HEP  Goal status: INITIAL  2.  Pt to demo full ROM for lumbar spine to be [facility] and pain free.   Goal status: INITIAL  3.  Pt to report decreased pain in back and LE to 0-2/10 with activity   Goal status: INITIAL  4.  Pt to demo improved ability for transfers, stairs, and gait without pain, to improve ability for IADLs and community activity.    Goal status: INITIAL  5. Pt to demo ability for bend, lift, squat motion with optimal mechanics, and no pain in back, to improve ability and pain with IADLs.     PLAN:  PT FREQUENCY: 1-2x/week  PT DURATION: 8 weeks  PLANNED INTERVENTIONS: Therapeutic exercises, Therapeutic activity, Neuromuscular re-education, Patient/Family education, Self Care, Joint mobilization, Joint manipulation, Stair training, Orthotic/Fit training,  DME instructions, Aquatic Therapy, Dry Needling, Electrical stimulation, Cryotherapy, Moist heat, Taping, Ultrasound, Ionotophoresis 4mg /ml Dexamethasone, Manual therapy,  Vasopneumatic device, Traction, Spinal manipulation, Spinal mobilization,Balance training, Gait training,   PLAN FOR NEXT SESSION:    Sedalia Muta, PT, DPT 11:18 AM  09/22/22

## 2022-09-24 ENCOUNTER — Ambulatory Visit: Payer: Medicare PPO | Admitting: Physical Therapy

## 2022-09-24 ENCOUNTER — Encounter: Payer: Self-pay | Admitting: Physical Therapy

## 2022-09-24 DIAGNOSIS — M5459 Other low back pain: Secondary | ICD-10-CM | POA: Diagnosis not present

## 2022-09-24 NOTE — Therapy (Signed)
OUTPATIENT PHYSICAL THERAPY LOWER EXTREMITY TREATMENT   Patient Name: Jacob Frost MRN: [id number] DOB:[birth year], 67 y.o., male Today's Date: 09/24/2022  END OF SESSION:  PT End of Session - 09/24/22 1201     Visit Number 2    Number of Visits 16    Date for PT Re-Evaluation 11/17/22    Authorization Type [employer]    PT Start Time 1205    PT Stop Time 1250    PT Time Calculation (min) 45 min    Activity Tolerance Patient tolerated treatment well;Patient limited by pain    Behavior During Therapy [facility] for tasks assessed/performed             Past Medical History:  Diagnosis Date   Aortic atherosclerosis (HCC)    COPD (chronic obstructive pulmonary disease) (HCC)    Emphysema of lung (HCC)    Hyperlipidemia    Hypertension    Past Surgical History:  Procedure Laterality Date   CARDIAC CATHETERIZATION     TONSILLECTOMY     WISDOM TOOTH EXTRACTION     Patient Active Problem List   Diagnosis Date Noted   Coronary artery disease 02/20/2022   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   Hyperglycemia 02/04/2021   Pulmonary nodules 01/31/2021   Insomnia 02/22/2020   Psoriasis 02/22/2020   Aortic atherosclerosis (HCC)    History of smoking 30 or more pack years 10/20/2017   ERECTILE DYSFUNCTION, 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    SUBJECTIVE:   SUBJECTIVE STATEMENT:  09/24/2022 Pt states quite a bit less pain in back. He has not been doing HEP. Still using cane, feels it is helping with walking.    Eval: Pt reports Newer onset of R low back pain, about 2-3 weeks ago. No injury  to report, no previous pain.  Wasn't that bad to start, but then worsened. Pain radiating into R LE/anterior thigh and into groin. No pain past knee.  Using cane now because of back pain, was not using prior.   Pt is retired, likes to be active at home.  Most pain:  sitting starts pain, and it eases a bit if he gets up and walks around. Also first thing in am.  R side pain, none on L.  Did have round of prednisone, did not get any relief. Baclofen: does not seem to be  helping , but he does feel pain is maybe a bit better in last couple days.  Not doing any exercises for back or regular exercise in general , just active around/outside of house.  Stairs: no stairs.  Heating pad: worse.   PERTINENT HISTORY: COPD, HTN, emphysema,  PAIN:  Are you having pain? Yes: NPRS scale: 2- 5 /10 Pain location: R low back Pain description: shooting, radiating Aggravating factors: sitting or standing too long, transfers.  Relieving factors: changing positions.   PRECAUTIONS: None  WEIGHT BEARING RESTRICTIONS: No  FALLS:  Has patient fallen in last 6 months? No  PLOF: Independent  PATIENT GOALS:  Decreased pain   NEXT MD VISIT:   OBJECTIVE:   DIAGNOSTIC FINDINGS:   PATIENT SURVEYS:   FOTO:  eval:  39   COGNITION: Overall cognitive status: Within functional limits for tasks assessed     SENSATION: WFL  EDEMA:   POSTURE:    Slight lateral shift to L to offload R.   PALPATION:  mild tenderness in low lumbar region, Less pain centrally, most pain in R glute  LOWER EXTREMITY ROM: Lumbar: flexion: mild/mod limitation,  extension: mod limitation with pain.  SB: mod limitation with inc pain to the R.  Hips: WFL Knees: WFL   LOWER EXTREMITY MMT:  MMT Left eval Right  eval  Hip flexion 4 4  Hip extension    Hip abduction  4-  pain  Hip adduction    Hip internal rotation    Hip external rotation  4-  pain  Knee flexion    Knee extension    Ankle dorsiflexion    Ankle  plantarflexion    Ankle inversion    Ankle eversion     (Blank rows = not tested)  LOWER EXTREMITY SPECIAL TESTS:  + SLR on R;  Able to tolerate prone on 1 pillow- but still with mild soreness. Increased pain with flat prone laying Comfortable in supine, hooklying position   FUNCTIONAL TESTS:    GAIT: Distance walked: 100 ft  Assistive device utilized: Single point cane Level of assistance: Modified independence Comments: antalgic gait    TODAY'S TREATMENT:                                                                                                                              DATE:   09/24/2022 Therapeutic Exercise: Aerobic: Supine:   pelvic tilts x 15;   Piriformis stretch 30 sec x 3  bil;  Prone over 1 pillow x 3 min,  Prone on elbows x 2 min,  prone press ups 2 x 5;  TA contraction x 15, education on achieving optimal contraction.  Seated: Standing:  lumbar extension x 12 Stretches:  Neuromuscular Re-education: Manual Therapy:  long leg distraction on R for lumbar , light lumbar PA mobs.  Therapeutic Activity: Self Care:   Previous:  Therapeutic Exercise: Aerobic: Supine:  education on hooklying/decompression position ,  pelvic tilts x 15;  Piriformis stretch 30 sec x 2 bil;  Trial for prone laying- able to tolerate laying on 1 pillow, still sore, but better than laying flat/prone.  Seated: Standing: trial for R side glides- painful.  Stretches:  Neuromuscular Re-education: Manual Therapy: long leg distraction on R for lumbar  Therapeutic Activity: Self Care:   PATIENT EDUCATION:  Education details: PT POC, Exam findings, HEP Person educated: Patient Education method: Explanation, Demonstration, Tactile cues, Verbal cues, and Handouts Education comprehension: verbalized understanding, returned demonstration, verbal cues required, tactile cues required, and needs further education  HOME EXERCISE PROGRAM: Access Code: [email or web address] URL:  [email or web address] Date: 09/22/2022 Prepared by: Sedalia Muta  Exercises - Supine Posterior Pelvic Tilt  - 2 x daily - 2 sets - 10 reps - Supine  Piriformis Stretch Pulling Heel to Hip  - 2 x daily - 3 reps - 30 hold  ASSESSMENT:  CLINICAL IMPRESSION: 09/24/2022 Pt with improved pain levels from earlier this week. He has more upright posture, with less L shift. He has improved ability for prone laying as well as press ups and extension position. Pt with difficulty with core activation, some improvement after education and practice today but will benefit from continued work on this. Discussed continuing to use cane, as well as doing HEP for pain.   Eval: Patient presents with primary complaint of pain in low back, radiating into R LE. Pt with symptoms of lumbar radiculopathy. He has Slight shift away from R in standing, due to increased pain with loading to R. He has antalgic gait and decreased tolerance for walking due to pain, and requires use of SPC at this time. He has poor movement quality , with decreased ROM and pain.  Pt with decreased ability for full functional activities, ADLS, and IADLs.  Pt will  benefit from skilled PT to improve deficits and pain and to return to PLOF.   OBJECTIVE IMPAIRMENTS: Abnormal gait, decreased activity tolerance, decreased mobility, decreased ROM, decreased strength, increased muscle spasms, and pain.   ACTIVITY LIMITATIONS: lifting, bending, sitting, standing, squatting, sleeping, transfers, bed mobility, dressing, reach over head, hygiene/grooming, and locomotion level  PARTICIPATION LIMITATIONS: meal prep, cleaning, laundry, driving, shopping, community activity, and yard work  PERSONAL FACTORS:  none  are also affecting patient's functional outcome.   REHAB POTENTIAL: Good  CLINICAL DECISION MAKING: Stable/uncomplicated  EVALUATION COMPLEXITY: Low   GOALS: Goals reviewed with patient? Yes  SHORT TERM GOALS: Target date:  10/06/2022   Pt to be independent with initial HEP  Goal status: INITIAL  2.  Pt to report decreased pain in back and leg to 0-4/10   Goal status: INITIAL   LONG TERM GOALS: Target date: 11/17/2022  Pt to be independent with final HEP  Goal status: INITIAL  2.  Pt to demo full ROM for lumbar spine to be [facility] and pain free.   Goal status: INITIAL  3.  Pt to report decreased pain in back and LE to 0-2/10 with activity   Goal status: INITIAL  4.  Pt to demo improved ability for transfers, stairs, and gait without pain, to improve ability for IADLs and community activity.    Goal status: INITIAL  5. Pt to demo ability for bend, lift, squat motion with optimal mechanics, and no pain in back, to improve ability and pain with IADLs.     PLAN:  PT FREQUENCY: 1-2x/week  PT DURATION: 8 weeks  PLANNED INTERVENTIONS: Therapeutic exercises, Therapeutic activity, Neuromuscular re-education, Patient/Family education, Self Care, Joint mobilization, Joint manipulation, Stair training, Orthotic/Fit training, DME instructions, Aquatic Therapy, Dry Needling, Electrical stimulation, Cryotherapy, Moist heat, Taping, Ultrasound, Ionotophoresis 4mg /ml Dexamethasone, Manual therapy,  Vasopneumatic device, Traction, Spinal manipulation, Spinal mobilization,Balance training, Gait training,   PLAN FOR NEXT SESSION:    Sedalia Muta, PT, DPT 12:02 PM  09/24/22

## 2022-09-30 ENCOUNTER — Ambulatory Visit: Payer: Medicare PPO | Admitting: Physical Therapy

## 2022-09-30 ENCOUNTER — Encounter: Payer: Self-pay | Admitting: Physical Therapy

## 2022-09-30 DIAGNOSIS — M5459 Other low back pain: Secondary | ICD-10-CM

## 2022-09-30 NOTE — Therapy (Signed)
OUTPATIENT PHYSICAL THERAPY LOWER EXTREMITY TREATMENT   Patient Name: Jacob Frost MRN: [id number] DOB:1955/06/18, 67 y.o., male Today's Date: 09/30/2022  END OF SESSION:  PT End of Session - 09/30/22 0919     Visit Number 3    Number of Visits 16    Date for PT Re-Evaluation 11/17/22    Authorization Type [employer]    PT Start Time 0925    PT Stop Time 1007    PT Time Calculation (min) 42 min    Activity Tolerance Patient tolerated treatment well;Patient limited by pain    Behavior During Therapy [facility] for tasks assessed/performed             Past Medical History:  Diagnosis Date   Aortic atherosclerosis (HCC)    COPD (chronic obstructive pulmonary disease) (HCC)    Emphysema of lung (HCC)    Hyperlipidemia    Hypertension    Past Surgical History:  Procedure Laterality Date   CARDIAC CATHETERIZATION     TONSILLECTOMY     WISDOM TOOTH EXTRACTION     Patient Active Problem List   Diagnosis Date Noted   Coronary artery disease 02/20/2022   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   Hyperglycemia 02/04/2021   Pulmonary nodules 01/31/2021   Insomnia 02/22/2020   Psoriasis 02/22/2020   Aortic atherosclerosis (HCC)    History of smoking 30 or more pack years 10/20/2017   ERECTILE DYSFUNCTION, 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    SUBJECTIVE:   SUBJECTIVE STATEMENT:  09/30/2022 Pt states more pain in back and leg last week from his last visit. Pain still intermittent. Not feeling too bad today and hasn't had much pain down leg in a day or so. He states most soreness in the morning.  Thinks it feels better when he gets up and walks around. Has been doing some of HEP.    Eval: Pt reports Newer onset of R low back pain, about 2-3 weeks ago. No injury to report, no previous pain.  Wasn't that bad to start, but then worsened. Pain radiating into R LE/anterior thigh and into groin. No pain past knee.  Using cane now because of back pain, was not using prior.   Pt is retired, likes to be active at home.  Most pain:  sitting starts pain, and it eases a bit if he gets up and walks around. Also first thing in am.  R side pain, none on L.  Did have round of prednisone, did not get any relief. Baclofen: does not seem to be  helping , but he does feel pain is maybe a bit better in last couple days.  Not doing any exercises for back or regular exercise in general , just active around/outside of house.  Stairs: no stairs.  Heating pad: worse.   PERTINENT HISTORY: COPD, HTN, emphysema,  PAIN:  Are you having pain? Yes: NPRS scale: 2- 5 /10 Pain location: R low back Pain description: shooting, radiating Aggravating factors: sitting or standing too long, transfers.  Relieving factors: changing positions.   PRECAUTIONS: None  WEIGHT BEARING RESTRICTIONS: No  FALLS:  Has patient  fallen in last 6 months? No  PLOF: Independent  PATIENT GOALS:  Decreased pain   NEXT MD VISIT:   OBJECTIVE:   DIAGNOSTIC FINDINGS:   PATIENT SURVEYS:   FOTO:  eval:  39   COGNITION: Overall cognitive status: Within functional limits for tasks assessed     SENSATION: WFL  EDEMA:   POSTURE:    Slight lateral shift to L to offload R.   PALPATION:  mild tenderness in low lumbar region, Less pain centrally, most pain in R glute  LOWER EXTREMITY ROM: Lumbar: flexion: mild/mod limitation,  extension: mod limitation with pain.  SB: mod limitation with inc pain to the R.  Hips: WFL Knees: WFL   LOWER EXTREMITY MMT:  MMT Left eval Right  eval  Hip flexion 4 4  Hip extension    Hip  abduction  4-  pain  Hip adduction    Hip internal rotation    Hip external rotation  4-  pain  Knee flexion    Knee extension    Ankle dorsiflexion    Ankle plantarflexion    Ankle inversion    Ankle eversion     (Blank rows = not tested)  LOWER EXTREMITY SPECIAL TESTS:  + SLR on R;  Able to tolerate prone on 1 pillow- but still with mild soreness. Increased pain with flat prone laying Comfortable in supine, hooklying position   FUNCTIONAL TESTS:    GAIT: Distance walked: 100 ft  Assistive device utilized: Single point cane Level of assistance: Modified independence Comments: antalgic gait    TODAY'S TREATMENT:                                                                                                                              DATE:   09/30/2022 Therapeutic Exercise: Aerobic: Supine:   pelvic tilts x 15;   Piriformis stretch 30 sec x 2  bil;  Prone over 1 pillow x 2 min,  flat/prone x 1 min;  Prone on elbows x1 min,  prone press ups 2 x 10;  TA contraction x 15, education on achieving optimal contraction.  Seated: Standing:  lumbar extension 2 x 10 Stretches:  Neuromuscular Re-education: Manual Therapy:  long leg distraction on R for lumbar,  light lumbar PA mobs.  Therapeutic Activity: Self Care:   Previous:  Therapeutic Exercise: Aerobic: Supine:  education on hooklying/decompression position ,  pelvic tilts x 15;  Piriformis stretch 30 sec x 2 bil;  Trial for prone laying- able to tolerate laying on 1 pillow, still sore, but better than laying flat/prone.  Seated: Standing: trial for R side glides- painful.  Stretches:  Neuromuscular Re-education: Manual Therapy: long leg distraction on R for lumbar  Therapeutic Activity: Self Care:   PATIENT EDUCATION:  Education details: PT POC, Exam findings, HEP Person educated: Patient Education method: Explanation, Demonstration, Tactile cues, Verbal cues, and Handouts Education comprehension: verbalized  understanding, returned demonstration, verbal cues required, tactile cues  required, and needs further education  HOME EXERCISE PROGRAM: Access Code: [email or web address] URL: [email or web address] Date: 09/22/2022 Prepared by: Sedalia Muta  Exercises - Supine Posterior Pelvic Tilt  - 2 x daily - 2 sets - 10 reps - Supine Piriformis Stretch Pulling Heel to Hip  - 2 x daily - 3 reps - 30 hold  ASSESSMENT:  CLINICAL IMPRESSION: 09/30/2022 Focus today on education on management of pain and symptoms. Pt with no increased pain in back or leg today with prone series. Discussed trying prone position a few times during the day, and seeing if it helps to decrease pain and radicular pain. Pt with better understanding of this for HEP. We also discussed trying not to overdo housework or too much bending/lifting. Plan to progress as tolerated.   Eval: Patient presents with primary complaint of pain in low back, radiating into R LE. Pt with symptoms of lumbar radiculopathy. He has Slight shift away from R in standing, due to increased pain with loading to R. He has antalgic gait and decreased tolerance for walking due to pain, and requires use of SPC at this time. He has poor movement quality , with decreased ROM and pain.  Pt with decreased ability for full functional activities, ADLS, and IADLs.  Pt will  benefit from skilled PT to improve deficits and pain and to return to PLOF.   OBJECTIVE IMPAIRMENTS: Abnormal gait, decreased activity tolerance, decreased mobility, decreased ROM, decreased strength, increased muscle spasms, and pain.   ACTIVITY LIMITATIONS: lifting, bending, sitting, standing, squatting, sleeping, transfers, bed mobility, dressing, reach over head, hygiene/grooming, and locomotion level  PARTICIPATION LIMITATIONS: meal prep, cleaning, laundry, driving, shopping, community activity, and yard work  PERSONAL FACTORS:  none  are also affecting patient's functional outcome.   REHAB  POTENTIAL: Good  CLINICAL DECISION MAKING: Stable/uncomplicated  EVALUATION COMPLEXITY: Low   GOALS: Goals reviewed with patient? Yes  SHORT TERM GOALS: Target date: 10/06/2022   Pt to be independent with initial HEP  Goal status: INITIAL  2.  Pt to report decreased pain in back and leg to 0-4/10   Goal status: INITIAL   LONG TERM GOALS: Target date: 11/17/2022  Pt to be independent with final HEP  Goal status: INITIAL  2.  Pt to demo full ROM for lumbar spine to be [facility] and pain free.   Goal status: INITIAL  3.  Pt to report decreased pain in back and LE to 0-2/10 with activity   Goal status: INITIAL  4.  Pt to demo improved ability for transfers, stairs, and gait without pain, to improve ability for IADLs and community activity.    Goal status: INITIAL  5. Pt to demo ability for bend, lift, squat motion with optimal mechanics, and no pain in back, to improve ability and pain with IADLs.     PLAN:  PT FREQUENCY: 1-2x/week  PT DURATION: 8 weeks  PLANNED INTERVENTIONS: Therapeutic exercises, Therapeutic activity, Neuromuscular re-education, Patient/Family education, Self Care, Joint mobilization, Joint manipulation, Stair training, Orthotic/Fit training, DME instructions, Aquatic Therapy, Dry Needling, Electrical stimulation, Cryotherapy, Moist heat, Taping, Ultrasound, Ionotophoresis 4mg /ml Dexamethasone, Manual therapy,  Vasopneumatic device, Traction, Spinal manipulation, Spinal mobilization,Balance training, Gait training,   PLAN FOR NEXT SESSION:    Sedalia Muta, PT, DPT 10:22 AM  09/30/22

## 2022-10-05 ENCOUNTER — Encounter: Payer: Self-pay | Admitting: Physical Therapy

## 2022-10-05 ENCOUNTER — Ambulatory Visit: Payer: Medicare PPO | Admitting: Physical Therapy

## 2022-10-05 DIAGNOSIS — M5459 Other low back pain: Secondary | ICD-10-CM

## 2022-10-05 NOTE — Therapy (Signed)
OUTPATIENT PHYSICAL THERAPY LOWER EXTREMITY TREATMENT   Patient Name: LAVI NITKA MRN: [id number] DOB:[birth year], 67 y.o., male Today's Date: 10/05/2022  END OF SESSION:  PT End of Session - 10/05/22 0932     Visit Number 4    Number of Visits 16    Date for PT Re-Evaluation 11/17/22    Authorization Type [employer]    PT Start Time 0932    PT Stop Time 1015    PT Time Calculation (min) 43 min    Activity Tolerance Patient tolerated treatment well;Patient limited by pain    Behavior During Therapy [facility] for tasks assessed/performed             Past Medical History:  Diagnosis Date   Aortic atherosclerosis (HCC)    COPD (chronic obstructive pulmonary disease) (HCC)    Emphysema of lung (HCC)    Hyperlipidemia    Hypertension    Past Surgical History:  Procedure Laterality Date   CARDIAC CATHETERIZATION     TONSILLECTOMY     WISDOM TOOTH EXTRACTION     Patient Active Problem List   Diagnosis Date Noted   Coronary artery disease 02/20/2022   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   Hyperglycemia 02/04/2021   Pulmonary nodules 01/31/2021   Insomnia 02/22/2020   Psoriasis 02/22/2020   Aortic atherosclerosis (HCC)    History of smoking 30 or more pack years 10/20/2017   ERECTILE DYSFUNCTION, 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    SUBJECTIVE:   SUBJECTIVE STATEMENT:  10/05/2022 Pt states he had a good day yesterday, minimal pain. Thinks that overall he is having less pain during the day. He has been waking up around 4 am with quite a bit of pain. Notes not having any pain into lower  leg, but R low back and some into R glute continues.     Eval: Pt reports Newer onset of R low back pain, about 2-3 weeks ago. No injury to report, no previous pain.  Wasn't that bad to start, but then worsened. Pain radiating into R LE/anterior thigh and into groin. No pain past knee.  Using cane now because of back pain, was not using prior.   Pt is retired, likes to be active at home.  Most pain:  sitting starts pain, and it eases a bit if he gets up and walks around. Also first thing in am.  R side pain, none on L.  Did have round of prednisone, did not get any relief. Baclofen: does not seem to be  helping , but he does feel pain is maybe a bit better in last couple days.  Not doing any exercises for back or regular exercise in general , just active around/outside of house.  Stairs: no stairs.  Heating pad: worse.   PERTINENT HISTORY: COPD, HTN, emphysema,  PAIN:  Are you having pain? Yes: NPRS scale: 2- 5 /10 Pain location: R low back Pain description: shooting, radiating Aggravating factors: sitting or standing too long, transfers.  Relieving factors: changing positions.   PRECAUTIONS: None  WEIGHT BEARING RESTRICTIONS: No  FALLS:  Has patient fallen in last 6  months? No  PLOF: Independent  PATIENT GOALS:  Decreased pain   NEXT MD VISIT:   OBJECTIVE:   DIAGNOSTIC FINDINGS:   PATIENT SURVEYS:   FOTO:  eval:  39   COGNITION: Overall cognitive status: Within functional limits for tasks assessed     SENSATION: WFL  EDEMA:   POSTURE:    Slight lateral shift to L to offload R.   PALPATION:  mild tenderness in low lumbar region, Less pain centrally, most pain in R glute  LOWER EXTREMITY ROM: Lumbar: flexion: mild/mod limitation,  extension: mod limitation with pain.  SB: mod limitation with inc pain to the R.  Hips: WFL Knees: WFL   LOWER EXTREMITY MMT:  MMT Left eval Right  eval  Hip flexion 4 4  Hip extension    Hip abduction  4-  pain  Hip  adduction    Hip internal rotation    Hip external rotation  4-  pain  Knee flexion    Knee extension    Ankle dorsiflexion    Ankle plantarflexion    Ankle inversion    Ankle eversion     (Blank rows = not tested)  LOWER EXTREMITY SPECIAL TESTS:  + SLR on R;  Able to tolerate prone on 1 pillow- but still with mild soreness. Increased pain with flat prone laying Comfortable in supine, hooklying position   FUNCTIONAL TESTS:    GAIT: Distance walked: 100 ft  Assistive device utilized: Single point cane Level of assistance: Modified independence Comments: antalgic gait    TODAY'S TREATMENT:                                                                                                                              DATE:   10/05/2022 Therapeutic Exercise: Aerobic: Supine:   pelvic tilts x 15;      prone press ups x 10;  TA contraction x 15 breathing , education on achieving optimal contraction.  Seated:   LAQ for nerve glide 2 x 12 on R;  Standing:  lumbar extension 2 x 10; Side glides to L x 15 at wall;   hip ext 2 x 10 bil;  march x 10 bil;  Stretches: pelvic tilts x 10, LTR x 10 , 5 sec holds.  Neuromuscular Re-education: Manual Therapy:  long leg distraction on R for lumbar,  light lumbar PA mobs.  Therapeutic Activity: Self Care:   Therapeutic Exercise: Aerobic: Supine:   pelvic tilts x 15;   Piriformis stretch 30 sec x 2  bil;  Prone over 1 pillow x 2 min,  flat/prone x 1 min;  Prone on elbows x1 min,  prone press ups 2 x 10;  TA contraction x 15, education on achieving optimal contraction.  Seated: Standing:  lumbar extension 2 x 10 Stretches:  Neuromuscular Re-education: Manual Therapy:  long leg distraction on R for lumbar,  light lumbar PA mobs.  Therapeutic Activity: Self Care:   Previous:  Therapeutic Exercise: Aerobic: Supine:  education on hooklying/decompression position ,  pelvic tilts x 15;  Piriformis stretch 30 sec x 2 bil;  Trial for prone  laying- able to tolerate laying on 1 pillow, still sore, but better than laying flat/prone.  Seated: Standing: trial for R side glides- painful.  Stretches:  Neuromuscular Re-education: Manual Therapy: long leg distraction on R for lumbar  Therapeutic Activity: Self Care:   PATIENT EDUCATION:  Education details: PT POC, Exam findings, HEP Person educated: Patient Education method: Explanation, Demonstration, Tactile cues, Verbal cues, and Handouts Education comprehension: verbalized understanding, returned demonstration, verbal cues required, tactile cues required, and needs further education  HOME EXERCISE PROGRAM: Access Code: [email or web address] URL: [email or web address] Date: 09/22/2022 Prepared by: Sedalia Muta  Exercises - Supine Posterior Pelvic Tilt  - 2 x daily - 2 sets - 10 reps - Supine Piriformis Stretch Pulling Heel to Hip  - 2 x daily - 3 reps - 30 hold  ASSESSMENT:  CLINICAL IMPRESSION: 10/05/2022 Continued focus today on education on management. Overall pain still variable, but seems to be improving, with more pain free times. We reviewed 2 exercise to try in supine in the night if he wakes up with pain. He notes increased pain with walking and need for continued use of SPC. Pt to benefit from continued care.   Eval: Patient presents with primary complaint of pain in low back, radiating into R LE. Pt with symptoms of lumbar radiculopathy. He has Slight shift away from R in standing, due to increased pain with loading to R. He has antalgic gait and decreased tolerance for walking due to pain, and requires use of SPC at this time. He has poor movement quality , with decreased ROM and pain.  Pt with decreased ability for full functional activities, ADLS, and IADLs.  Pt will  benefit from skilled PT to improve deficits and pain and to return to PLOF.   OBJECTIVE IMPAIRMENTS: Abnormal gait, decreased activity tolerance, decreased mobility, decreased ROM, decreased  strength, increased muscle spasms, and pain.   ACTIVITY LIMITATIONS: lifting, bending, sitting, standing, squatting, sleeping, transfers, bed mobility, dressing, reach over head, hygiene/grooming, and locomotion level  PARTICIPATION LIMITATIONS: meal prep, cleaning, laundry, driving, shopping, community activity, and yard work  PERSONAL FACTORS:  none  are also affecting patient's functional outcome.   REHAB POTENTIAL: Good  CLINICAL DECISION MAKING: Stable/uncomplicated  EVALUATION COMPLEXITY: Low   GOALS: Goals reviewed with patient? Yes  SHORT TERM GOALS: Target date: 10/06/2022   Pt to be independent with initial HEP  Goal status: INITIAL  2.  Pt to report decreased pain in back and leg to 0-4/10   Goal status: INITIAL   LONG TERM GOALS: Target date: 11/17/2022  Pt to be independent with final HEP  Goal status: INITIAL  2.  Pt to demo full ROM for lumbar spine to be [facility] and pain free.   Goal status: INITIAL  3.  Pt to report decreased pain in back and LE to 0-2/10 with activity   Goal status: INITIAL  4.  Pt to demo improved ability for transfers, stairs, and gait without pain, to improve ability for IADLs and community activity.    Goal status: INITIAL  5. Pt to demo ability for bend, lift, squat motion with optimal mechanics, and no pain in back, to improve ability and pain with IADLs.     PLAN:  PT FREQUENCY: 1-2x/week  PT DURATION: 8 weeks  PLANNED INTERVENTIONS: Therapeutic exercises, Therapeutic activity, Neuromuscular re-education, Patient/Family education,  Self Care, Joint mobilization, Joint manipulation, Stair training, Orthotic/Fit training, DME instructions, Aquatic Therapy, Dry Needling, Electrical stimulation, Cryotherapy, Moist heat, Taping, Ultrasound, Ionotophoresis 4mg /ml Dexamethasone, Manual therapy,  Vasopneumatic device, Traction, Spinal manipulation, Spinal mobilization,Balance training, Gait training,   PLAN FOR NEXT SESSION:     Sedalia Muta, PT, DPT 12:38 PM  10/05/22

## 2022-10-06 ENCOUNTER — Ambulatory Visit: Payer: Medicare PPO

## 2022-10-07 ENCOUNTER — Encounter: Payer: Medicare PPO | Admitting: Physical Therapy

## 2022-10-07 ENCOUNTER — Ambulatory Visit: Payer: Medicare PPO

## 2022-10-12 ENCOUNTER — Encounter: Payer: Medicare PPO | Admitting: Physical Therapy

## 2022-10-14 ENCOUNTER — Encounter: Payer: Medicare PPO | Admitting: Physical Therapy

## 2022-10-17 ENCOUNTER — Other Ambulatory Visit: Payer: Self-pay | Admitting: Family Medicine

## 2022-10-19 ENCOUNTER — Telehealth: Payer: Self-pay | Admitting: Family Medicine

## 2022-10-19 ENCOUNTER — Other Ambulatory Visit: Payer: Self-pay | Admitting: *Deleted

## 2022-10-19 MED ORDER — LISINOPRIL 5 MG PO TABS: 5.0000 mg | ORAL_TABLET | Freq: Every day | ORAL | 1 refills | Status: DC

## 2022-10-19 NOTE — Telephone Encounter (Signed)
Prescription Request  10/19/2022  LOV: 09/07/2022  What is the name of the medication or equipment? lisinopril (ZESTRIL) 5 MG tablet  Patient states he was informed by Francena Hanly to call back to remind her that they send in medication correctly this time in terms of frequency it's taken; One pill one time daily.   Have you contacted your pharmacy to request a refill? Yes   Which pharmacy would you like this sent to?  [id number] - [place], [place] - [address] [place] [address] Phone: [phone] Fax: [phone]    Patient notified that their request is being sent to the clinical staff for review and that they should receive a response within 2 business days.   Please advise at Mobile [phone] (mobile)

## 2022-10-19 NOTE — Telephone Encounter (Signed)
Rx Lisinopril send to [facility]

## 2022-10-20 ENCOUNTER — Encounter: Payer: Medicare PPO | Admitting: Physical Therapy

## 2022-10-22 ENCOUNTER — Encounter: Payer: Medicare PPO | Admitting: Physical Therapy

## 2022-10-26 ENCOUNTER — Encounter: Payer: Self-pay | Admitting: Physical Therapy

## 2022-10-26 ENCOUNTER — Ambulatory Visit: Payer: Medicare PPO | Admitting: Physical Therapy

## 2022-10-26 DIAGNOSIS — M5459 Other low back pain: Secondary | ICD-10-CM

## 2022-10-26 NOTE — Therapy (Signed)
OUTPATIENT PHYSICAL THERAPY LOWER EXTREMITY TREATMENT   Patient Name: Jacob Frost MRN: [id number] DOB:1955-02-01, 67 y.o., male Today's Date: 10/26/2022  END OF SESSION:  PT End of Session - 10/26/22 1011     Visit Number 5    Number of Visits 16    Date for PT Re-Evaluation 11/17/22    Authorization Type [employer]    PT Start Time 1014    PT Stop Time 1057    PT Time Calculation (min) 43 min    Activity Tolerance Patient tolerated treatment well;Patient limited by pain    Behavior During Therapy WFL for tasks assessed/performed             Past Medical History:  Diagnosis Date   Aortic atherosclerosis (HCC)    COPD (chronic obstructive pulmonary disease) (HCC)    Emphysema of lung (HCC)    Hyperlipidemia    Hypertension    Past Surgical History:  Procedure Laterality Date   CARDIAC CATHETERIZATION     TONSILLECTOMY     WISDOM TOOTH EXTRACTION     Patient Active Problem List   Diagnosis Date Noted   Coronary artery disease 02/20/2022   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   Hyperglycemia 02/04/2021   Pulmonary nodules 01/31/2021   Insomnia 02/22/2020   Psoriasis 02/22/2020   Aortic atherosclerosis (HCC)    History of smoking 30 or more pack years 10/20/2017   ERECTILE DYSFUNCTION, 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    SUBJECTIVE:   SUBJECTIVE STATEMENT:  10/26/2022  Pt last seen 9/9. Is having less pain but is having pain daily. Most pain at night. Still having pain and numbness into leg and  waking him up at night. Still taking baclofen.   Eval: Pt reports Newer  onset of R low back pain, about 2-3 weeks ago. No injury to report, no previous pain.  Wasn't that bad to start, but then worsened. Pain radiating into R LE/anterior thigh and into groin. No pain past knee.  Using cane now because of back pain, was not using prior.   Pt is retired, likes to be active at home.  Most pain:  sitting starts pain, and it eases a bit if he gets up and walks around. Also first thing in am.  R side pain, none on L.  Did have round of prednisone, did not get any relief. Baclofen: does not seem to be  helping , but he does feel pain is maybe a bit better in last couple days.  Not doing any exercises for back or regular exercise in general , just active around/outside of house.  Stairs: no stairs.  Heating pad: worse.   PERTINENT HISTORY: COPD, HTN, emphysema,  PAIN:   Are you having pain? Yes: NPRS scale: 2- 5 /10 Pain location: R low back into R leg  Pain description: shooting, radiating Aggravating factors: sitting or standing too long, transfers.  Relieving factors: changing positions.   PRECAUTIONS: None  WEIGHT BEARING RESTRICTIONS: No  FALLS:  Has patient fallen in last 6 months? No  PLOF: Independent  PATIENT GOALS:  Decreased pain   NEXT MD  VISIT:   OBJECTIVE:     DIAGNOSTIC FINDINGS:   PATIENT SURVEYS:   FOTO:  eval:  39  Visit  5:   57.2  COGNITION: Overall cognitive status: Within functional limits for tasks assessed     SENSATION: WFL  EDEMA:   POSTURE:    Slight lateral shift to L to offload R.   PALPATION:  mild tenderness in low lumbar region, Less pain centrally, most pain in R glute  LOWER EXTREMITY ROM: Lumbar: flexion: mild limitation,  extension: WFL SB: mild limitation with  slight inc pain to the R.  Hips: WFL Knees: WFL   LOWER EXTREMITY MMT:  MMT Left eval Right  eval  Hip flexion 4 4  Hip extension    Hip abduction  4-  pain  Hip adduction    Hip internal rotation    Hip external rotation  4-  pain   Knee flexion    Knee extension    Ankle dorsiflexion    Ankle plantarflexion    Ankle inversion    Ankle eversion     (Blank rows = not tested)  LOWER EXTREMITY SPECIAL TESTS:    FUNCTIONAL TESTS:    GAIT: Distance walked: 100 ft  Assistive device utilized: Single point cane Level of assistance: Modified independence Comments: antalgic gait    TODAY'S TREATMENT:                                                                                                                              DATE:   10/26/2022 Therapeutic Exercise: Aerobic: Supine:   pelvic tilts x 15;   bridging 2 x 5;  Seated:   Standing:  lumbar extension  x 10;   Stretches: pelvic tilts x 10,  piriformis 30 sec x 3 bil;  LTR x 10 , 5 sec holds. ;  SKTC 20 sec x 2 bil;  Neuromuscular Re-education: Manual Therapy:  long leg distraction on R for lumbar,  light lumbar PA mobs, STM/DTM to R lumbar paraspinals.  Therapeutic Activity: Self Care:   Previous: LAQ- nerve glide   Therapeutic Exercise: Aerobic: Supine:   pelvic tilts x 15;   Piriformis stretch 30 sec x 2  bil;  Prone over 1 pillow x 2 min,  flat/prone x 1 min;  Prone on elbows x1 min,  prone press ups 2 x 10;  TA contraction x 15, education on achieving optimal contraction.  Seated: Standing:  lumbar extension 2 x 10 Stretches:  Neuromuscular Re-education: Manual Therapy:  long leg distraction on R for lumbar,  light lumbar PA mobs.  Therapeutic Activity: Self Care:   Previous:  Therapeutic Exercise: Aerobic: Supine:  education on hooklying/decompression position ,  pelvic tilts x 15;  Piriformis stretch 30 sec x 2 bil;  Trial for prone laying- able to tolerate laying on 1 pillow, still sore, but better than laying flat/prone.  Seated: Standing: trial for R side glides- painful.  Stretches:  Neuromuscular  Re-education: Manual Therapy: long leg distraction on R for lumbar  Therapeutic Activity: Self Care:   PATIENT EDUCATION:   Education details: PT POC, Exam findings, HEP Person educated: Patient Education method: Explanation, Demonstration, Tactile cues, Verbal cues, and Handouts Education comprehension: verbalized understanding, returned demonstration, verbal cues required, tactile cues required, and needs further education  HOME EXERCISE PROGRAM: Access Code: [email or web address]  ASSESSMENT:  CLINICAL IMPRESSION: 10/26/2022  Pt overall with less pain. He has improved gait, and is not using SPC today. He will benefit from continued mobility, and progression of core and hip strength as tolerated. He requires max cuing for ther ex and HEP instruction. He continues to have some pain into R LE, as well as some pain with R SB/loading. We discussed trying to do some of the HEP in evening if he has increased pain at that time. Reviewed HEP in detail today.   Eval: Patient presents with primary complaint of pain in low back, radiating into R LE. Pt with symptoms of lumbar radiculopathy. He has Slight shift away from R in standing, due to increased pain with loading to R. He has antalgic gait and decreased tolerance for walking due to pain, and requires use of SPC at this time. He has poor movement quality , with decreased ROM and pain.  Pt with decreased ability for full functional activities, ADLS, and IADLs.  Pt will  benefit from skilled PT to improve deficits and pain and to return to PLOF.   OBJECTIVE IMPAIRMENTS: Abnormal gait, decreased activity tolerance, decreased mobility, decreased ROM, decreased strength, increased muscle spasms, and pain.   ACTIVITY LIMITATIONS: lifting, bending, sitting, standing, squatting, sleeping, transfers, bed mobility, dressing, reach over head, hygiene/grooming, and locomotion level  PARTICIPATION LIMITATIONS: meal prep, cleaning, laundry, driving, shopping, community activity, and yard work  PERSONAL FACTORS:  none  are also affecting patient's functional outcome.   REHAB POTENTIAL:  Good  CLINICAL DECISION MAKING: Stable/uncomplicated  EVALUATION COMPLEXITY: Low   GOALS: Goals reviewed with patient? Yes  SHORT TERM GOALS: Target date: 10/06/2022   Pt to be independent with initial HEP  Goal status: INITIAL  2.  Pt to report decreased pain in back and leg to 0-4/10   Goal status: INITIAL   LONG TERM GOALS: Target date: 11/17/2022  Pt to be independent with final HEP  Goal status: INITIAL  2.  Pt to demo full ROM for lumbar spine to be [facility] and pain free.   Goal status: INITIAL  3.  Pt to report decreased pain in back and LE to 0-2/10 with activity   Goal status: INITIAL  4.  Pt to demo improved ability for transfers, stairs, and gait without pain, to improve ability for IADLs and community activity.    Goal status: INITIAL  5. Pt to demo ability for bend, lift, squat motion with optimal mechanics, and no pain in back, to improve ability and pain with IADLs.     PLAN:  PT FREQUENCY: 1-2x/week  PT DURATION: 8 weeks  PLANNED INTERVENTIONS: Therapeutic exercises, Therapeutic activity, Neuromuscular re-education, Patient/Family education, Self Care, Joint mobilization, Joint manipulation, Stair training, Orthotic/Fit training, DME instructions, Aquatic Therapy, Dry Needling, Electrical stimulation, Cryotherapy, Moist heat, Taping, Ultrasound, Ionotophoresis 4mg /ml Dexamethasone, Manual therapy,  Vasopneumatic device, Traction, Spinal manipulation, Spinal mobilization,Balance training, Gait training,   PLAN FOR NEXT SESSION:  hip abd- standing, core strength, lumbar mobility, STM- R lumbar    Sedalia Muta, PT, DPT 1:47 PM  10/26/22

## 2022-10-28 ENCOUNTER — Ambulatory Visit: Payer: Medicare PPO | Admitting: Physical Therapy

## 2022-10-28 ENCOUNTER — Encounter: Payer: Self-pay | Admitting: Physical Therapy

## 2022-10-28 ENCOUNTER — Encounter: Payer: Medicare PPO | Admitting: Physical Therapy

## 2022-10-28 DIAGNOSIS — M5459 Other low back pain: Secondary | ICD-10-CM

## 2022-10-28 DIAGNOSIS — R911 Solitary pulmonary nodule: Secondary | ICD-10-CM | POA: Diagnosis not present

## 2022-10-28 DIAGNOSIS — R918 Other nonspecific abnormal finding of lung field: Secondary | ICD-10-CM | POA: Diagnosis not present

## 2022-10-28 DIAGNOSIS — D3501 Benign neoplasm of right adrenal gland: Secondary | ICD-10-CM | POA: Diagnosis not present

## 2022-10-28 DIAGNOSIS — J432 Centrilobular emphysema: Secondary | ICD-10-CM | POA: Diagnosis not present

## 2022-10-28 NOTE — Therapy (Signed)
OUTPATIENT PHYSICAL THERAPY LOWER EXTREMITY TREATMENT   Patient Name: Jacob Frost MRN: [id number] DOB:09/28/1955, 67 y.o., male Today's Date: 10/28/2022  END OF SESSION:  PT End of Session - 10/28/22 1251     Visit Number 6    Number of Visits 16    Date for PT Re-Evaluation 11/17/22    Authorization Type [employer]    PT Start Time 1253    PT Stop Time 1335    PT Time Calculation (min) 42 min    Activity Tolerance Patient tolerated treatment well;Patient limited by pain    Behavior During Therapy WFL for tasks assessed/performed             Past Medical History:  Diagnosis Date   Aortic atherosclerosis (HCC)    COPD (chronic obstructive pulmonary disease) (HCC)    Emphysema of lung (HCC)    Hyperlipidemia    Hypertension    Past Surgical History:  Procedure Laterality Date   CARDIAC CATHETERIZATION     TONSILLECTOMY     WISDOM TOOTH EXTRACTION     Patient Active Problem List   Diagnosis Date Noted   Coronary artery disease 02/20/2022   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   Hyperglycemia 02/04/2021   Pulmonary nodules 01/31/2021   Insomnia 02/22/2020   Psoriasis 02/22/2020   Aortic atherosclerosis (HCC)    History of smoking 30 or more pack years 10/20/2017   ERECTILE DYSFUNCTION, 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    SUBJECTIVE:   SUBJECTIVE STATEMENT:  10/28/2022 Pt states minimal pain in the last couple days.   Eval: Pt reports Newer onset of R low back pain, about 2-3 weeks ago. No injury to report, no previous pain.  Wasn't that bad to start, but then  worsened. Pain radiating into R LE/anterior thigh and into groin. No pain past knee.  Using cane now because of back pain, was not using prior.   Pt is retired, likes to be active at home.  Most pain:  sitting starts pain, and it eases a bit if he gets up and walks around. Also first thing in am.  R side pain, none on L.  Did have round of prednisone, did not get any relief. Baclofen: does not seem to be  helping , but he does feel pain is maybe a bit better in last couple days.  Not doing any exercises for back or regular exercise in general , just active around/outside of house.  Stairs: no stairs.  Heating pad: worse.   PERTINENT HISTORY: COPD, HTN, emphysema,  PAIN:   Are you having pain? Yes: NPRS scale: 2- 5 /10 Pain location: R low back into R leg  Pain description: shooting, radiating Aggravating factors: sitting or standing too long, transfers.  Relieving factors: changing positions.   PRECAUTIONS: None  WEIGHT BEARING RESTRICTIONS: No  FALLS:  Has patient fallen in last 6 months? No  PLOF: Independent  PATIENT GOALS:  Decreased pain   NEXT MD VISIT:   OBJECTIVE:     DIAGNOSTIC FINDINGS:   PATIENT SURVEYS:   FOTO:  eval:  39  Visit  5:  57.2  COGNITION: Overall cognitive status: Within functional limits for tasks assessed     SENSATION: WFL  EDEMA:   POSTURE:    Slight lateral shift to L to offload R.   PALPATION:  mild tenderness in low lumbar region, Less pain centrally, most pain in R glute  LOWER EXTREMITY ROM: Lumbar: flexion: mild limitation,  extension: WFL SB: mild limitation with  slight inc pain to the R.  Hips: WFL Knees: WFL   LOWER EXTREMITY MMT:  MMT Left eval Right  eval  Hip flexion 4 4  Hip extension    Hip abduction  4-  pain  Hip adduction    Hip internal rotation    Hip external rotation  4-  pain  Knee flexion    Knee extension    Ankle dorsiflexion    Ankle plantarflexion    Ankle inversion    Ankle eversion      (Blank rows = not tested)  LOWER EXTREMITY SPECIAL TESTS:    FUNCTIONAL TESTS:    GAIT: Distance walked: 100 ft  Assistive device utilized: Single point cane Level of assistance: Modified independence Comments: antalgic gait    TODAY'S TREATMENT:                                                                                                                              DATE:   10/28/2022 Therapeutic Exercise: Aerobic: Supine:    bridging x 15 Prone: hip ext x 10 bil;  S/L: hip abd x 10 bil;  Seated:   sit to stand x10 wit hip hinge  Standing:  hip abd 2 x 10 bil;  Marching x 20; heel raises x 20;  Stretches:  Seated HSS 30 sec x 3 bil;    SKTC 20 sec x 2 bil;  Neuromuscular Re-education: Manual Therapy:   light lumbar PA mobs, STM/DTM to R lumbar paraspinals.  Therapeutic Activity: Self Care:   Previous: LAQ- nerve glide   Therapeutic Exercise: Aerobic: Supine:   pelvic tilts x 15;   Piriformis stretch 30 sec x 2  bil;  Prone over 1 pillow x 2 min,  flat/prone x 1 min;  Prone on elbows x1 min,  prone press ups 2 x 10;  TA contraction x 15, education on achieving optimal contraction.  Seated: Standing:  lumbar extension 2 x 10 Stretches:  Neuromuscular Re-education: Manual Therapy:  long leg distraction on R for lumbar,  light lumbar PA mobs.  Therapeutic Activity: Self Care:   Previous:  Therapeutic Exercise: Aerobic: Supine:  education on hooklying/decompression position ,  pelvic tilts x 15;  Piriformis stretch 30 sec x 2 bil;  Trial for prone laying- able to tolerate laying on 1 pillow, still sore, but better than laying flat/prone.  Seated: Standing: trial for R side glides- painful.  Stretches:  Neuromuscular Re-education: Manual Therapy: long leg distraction on R for lumbar  Therapeutic Activity: Self Care:   PATIENT EDUCATION:  Education  details: updated and reviewed HEP.  Person educated: Patient Education method: Explanation,  Demonstration, Tactile cues, Verbal cues, and Handouts Education comprehension: verbalized understanding, returned demonstration, verbal cues required, tactile cues required, and needs further education  HOME EXERCISE PROGRAM: Access Code: [email or web address]  ASSESSMENT:  CLINICAL IMPRESSION: 10/28/2022  Reviewed standing hip stability and position with ther ex and walking today. Pt demonstrates and feels weakness in R LE, and has decreased confidence with SLS and walking. Pt with improving pain levels, and will benefit from progressive strengthening for hips and LEs, to improve gait and functional mobility.    Eval: Patient presents with primary complaint of pain in low back, radiating into R LE. Pt with symptoms of lumbar radiculopathy. He has Slight shift away from R in standing, due to increased pain with loading to R. He has antalgic gait and decreased tolerance for walking due to pain, and requires use of SPC at this time. He has poor movement quality , with decreased ROM and pain.  Pt with decreased ability for full functional activities, ADLS, and IADLs.  Pt will  benefit from skilled PT to improve deficits and pain and to return to PLOF.   OBJECTIVE IMPAIRMENTS: Abnormal gait, decreased activity tolerance, decreased mobility, decreased ROM, decreased strength, increased muscle spasms, and pain.   ACTIVITY LIMITATIONS: lifting, bending, sitting, standing, squatting, sleeping, transfers, bed mobility, dressing, reach over head, hygiene/grooming, and locomotion level  PARTICIPATION LIMITATIONS: meal prep, cleaning, laundry, driving, shopping, community activity, and yard work  PERSONAL FACTORS:  none  are also affecting patient's functional outcome.   REHAB POTENTIAL: Good  CLINICAL DECISION MAKING: Stable/uncomplicated  EVALUATION COMPLEXITY: Low   GOALS: Goals reviewed with patient? Yes  SHORT TERM GOALS: Target date: 10/06/2022   Pt to be independent with initial HEP  Goal status:  INITIAL  2.  Pt to report decreased pain in back and leg to 0-4/10   Goal status: INITIAL   LONG TERM GOALS: Target date: 11/17/2022  Pt to be independent with final HEP  Goal status: INITIAL  2.  Pt to demo full ROM for lumbar spine to be [facility] and pain free.   Goal status: INITIAL  3.  Pt to report decreased pain in back and LE to 0-2/10 with activity   Goal status: INITIAL  4.  Pt to demo improved ability for transfers, stairs, and gait without pain, to improve ability for IADLs and community activity.    Goal status: INITIAL  5. Pt to demo ability for bend, lift, squat motion with optimal mechanics, and no pain in back, to improve ability and pain with IADLs.     PLAN:  PT FREQUENCY: 1-2x/week  PT DURATION: 8 weeks  PLANNED INTERVENTIONS: Therapeutic exercises, Therapeutic activity, Neuromuscular re-education, Patient/Family education, Self Care, Joint mobilization, Joint manipulation, Stair training, Orthotic/Fit training, DME instructions, Aquatic Therapy, Dry Needling, Electrical stimulation, Cryotherapy, Moist heat, Taping, Ultrasound, Ionotophoresis 4mg /ml Dexamethasone, Manual therapy,  Vasopneumatic device, Traction, Spinal manipulation, Spinal mobilization,Balance training, Gait training,   PLAN FOR NEXT SESSION:  hip abd- standing, core strength, lumbar mobility, STM- R lumbar    Sedalia Muta, PT, DPT 12:54 PM  10/28/22

## 2022-10-30 DIAGNOSIS — J432 Centrilobular emphysema: Secondary | ICD-10-CM | POA: Diagnosis not present

## 2022-11-02 ENCOUNTER — Ambulatory Visit: Payer: Medicare PPO | Admitting: Physical Therapy

## 2022-11-02 DIAGNOSIS — M5459 Other low back pain: Secondary | ICD-10-CM | POA: Diagnosis not present

## 2022-11-02 NOTE — Therapy (Unsigned)
OUTPATIENT PHYSICAL THERAPY LOWER EXTREMITY TREATMENT   Patient Name: Jacob Frost MRN: [id number] DOB:08-Jun-1955, 67 y.o., male Today's Date: 11/02/2022  END OF SESSION:    Past Medical History:  Diagnosis Date   Aortic atherosclerosis (HCC)    COPD (chronic obstructive pulmonary disease) (HCC)    Emphysema of lung (HCC)    Hyperlipidemia    Hypertension    Past Surgical History:  Procedure Laterality Date   CARDIAC CATHETERIZATION     TONSILLECTOMY     WISDOM TOOTH EXTRACTION     Patient Active Problem List   Diagnosis Date Noted   Coronary artery disease 02/20/2022   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   Hyperglycemia 02/04/2021   Pulmonary nodules 01/31/2021   Insomnia 02/22/2020   Psoriasis 02/22/2020   Aortic atherosclerosis (HCC)    History of smoking 30 or more pack years 10/20/2017   ERECTILE DYSFUNCTION, 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  Rationale for Evaluation and Treatment: Rehabilitation  ONSET DATE: 2-3 weeks ago.    SUBJECTIVE:   SUBJECTIVE STATEMENT:  11/02/2022 Pt states minimal pain in the last couple days. Thinks back is doing much better. States pain in lateral side of R knee, which is newer.   Eval: Pt reports Newer onset of R low back pain, about 2-3 weeks ago. No injury to report, no previous pain.  Wasn't that bad to start, but then worsened. Pain radiating into R LE/anterior thigh and into groin. No pain past knee.  Using cane now because of back pain, was not using prior.   Pt is retired, likes to be active at home.  Most pain:  sitting starts pain, and it eases a bit if he gets up and walks around. Also first thing in am.  R side  pain, none on L.  Did have round of prednisone, did not get any relief. Baclofen: does not seem to be  helping , but he does feel pain is maybe a bit better in last couple days.  Not doing any exercises for back or regular exercise in general , just active around/outside of house.  Stairs: no stairs.  Heating pad: worse.   PERTINENT HISTORY: COPD, HTN, emphysema,  PAIN:   Are you having pain? Yes: NPRS scale: 2- 5 /10 Pain location: R low back into R leg  Pain description: shooting, radiating Aggravating factors: sitting or standing too long, transfers.  Relieving factors: changing positions.   PRECAUTIONS: None  WEIGHT BEARING RESTRICTIONS: No  FALLS:  Has patient fallen in last 6 months? No  PLOF: Independent  PATIENT GOALS:  Decreased pain   NEXT MD VISIT:   OBJECTIVE:     DIAGNOSTIC FINDINGS:   PATIENT SURVEYS:   FOTO:  eval:  39  Visit  5:   57.2  COGNITION: Overall cognitive status: Within functional limits for tasks assessed     SENSATION: WFL  EDEMA:   POSTURE:    Slight lateral shift to L to offload R.   PALPATION:  mild tenderness in low lumbar region, Less pain centrally, most pain in R glute  LOWER EXTREMITY ROM: Lumbar: flexion: mild limitation,  extension: WFL SB: mild limitation with  slight inc pain to the R.  Hips: WFL Knees: WFL   LOWER EXTREMITY MMT:  MMT Left eval Right  eval  Hip flexion 4 4  Hip extension    Hip abduction  4-  pain  Hip adduction    Hip internal rotation    Hip external rotation  4-  pain  Knee flexion    Knee extension    Ankle dorsiflexion    Ankle plantarflexion    Ankle inversion    Ankle eversion     (Blank rows = not tested)  LOWER EXTREMITY SPECIAL TESTS:    FUNCTIONAL TESTS:    GAIT: Distance walked: 100 ft  Assistive device utilized: Single point cane Level of assistance: Modified independence Comments: antalgic gait    TODAY'S TREATMENT:                                                                                                                               DATE:   11/02/2022 Therapeutic Exercise: Aerobic: Supine:    bridging x 15 Prone: hip ext x 10 bil;  S/L: hip abd x 10 bil;  Seated:   sit to stand x10 wit hip hinge ;  LAQ x 10 bil for knees.  Standing:  hip abd 2 x 10 bil;  Marching x 20; heel raises x 20;  Stretches:  Seated HSS 30 sec x 3 bil;    SKTC 20 sec x 2 bil;  Neuromuscular Re-education: Manual Therapy:   light lumbar PA mobs, STM/DTM to R lumbar paraspinals.  Therapeutic Activity: Self Care:    Therapeutic Exercise: Aerobic: Supine:    bridging x 15 Prone: hip ext x 10 bil;  S/L: hip abd x 10 bil;  Seated:   sit to stand x10 wit hip hinge  Standing:  hip abd 2 x 10 bil;  Marching x 20; heel raises x 20;  Stretches:  Seated HSS 30 sec x 3 bil;    SKTC 20 sec x 2 bil;  Neuromuscular Re-education: Manual Therapy:   light lumbar PA mobs, STM/DTM to R lumbar paraspinals.  Therapeutic Activity: Self Care:   Previous: LAQ- nerve glide   Therapeutic Exercise: Aerobic: Supine:   pelvic tilts x 15;   Piriformis stretch 30 sec x 2  bil;  Prone over 1 pillow x 2 min,  flat/prone x 1 min;  Prone on elbows x1 min,  prone press ups 2 x 10;  TA contraction x 15, education on achieving optimal contraction.  Seated: Standing:  lumbar extension 2 x 10 Stretches:  Neuromuscular Re-education: Manual Therapy:  long leg distraction on R for lumbar,  light lumbar PA mobs.  Therapeutic Activity: Self Care:   Previous:  Therapeutic Exercise: Aerobic: Supine:  education on hooklying/decompression position ,  pelvic tilts x 15;  Piriformis stretch 30 sec x 2 bil;  Trial for prone laying- able to tolerate laying on 1 pillow, still sore, but better than laying flat/prone.  Seated: Standing: trial for R side  glides- painful.  Stretches:  Neuromuscular Re-education: Manual Therapy: long leg distraction on R for lumbar  Therapeutic Activity: Self  Care:   PATIENT EDUCATION:  Education details: updated and reviewed HEP.  Person educated: Patient Education method: Explanation, Demonstration, Tactile cues, Verbal cues, and Handouts Education comprehension: verbalized understanding, returned demonstration, verbal cues required, tactile cues required, and needs further education  HOME EXERCISE PROGRAM: Access Code: [email or web address]  ASSESSMENT:  CLINICAL IMPRESSION: 11/02/2022  Reviewed standing hip stability and position with ther ex and walking today. Pt demonstrates and feels weakness in R LE, and has decreased confidence with SLS and walking. Pt with improving pain levels, and will benefit from progressive strengthening for hips and LEs, to improve gait and functional mobility.    Eval: Patient presents with primary complaint of pain in low back, radiating into R LE. Pt with symptoms of lumbar radiculopathy. He has Slight shift away from R in standing, due to increased pain with loading to R. He has antalgic gait and decreased tolerance for walking due to pain, and requires use of SPC at this time. He has poor movement quality , with decreased ROM and pain.  Pt with decreased ability for full functional activities, ADLS, and IADLs.  Pt will  benefit from skilled PT to improve deficits and pain and to return to PLOF.   OBJECTIVE IMPAIRMENTS: Abnormal gait, decreased activity tolerance, decreased mobility, decreased ROM, decreased strength, increased muscle spasms, and pain.   ACTIVITY LIMITATIONS: lifting, bending, sitting, standing, squatting, sleeping, transfers, bed mobility, dressing, reach over head, hygiene/grooming, and locomotion level  PARTICIPATION LIMITATIONS: meal prep, cleaning, laundry, driving, shopping, community activity, and yard work  PERSONAL FACTORS:  none  are also affecting patient's functional outcome.   REHAB POTENTIAL: Good  CLINICAL DECISION MAKING: Stable/uncomplicated  EVALUATION COMPLEXITY:  Low   GOALS: Goals reviewed with patient? Yes  SHORT TERM GOALS: Target date: 10/06/2022   Pt to be independent with initial HEP  Goal status: INITIAL  2.  Pt to report decreased pain in back and leg to 0-4/10   Goal status: INITIAL   LONG TERM GOALS: Target date: 11/17/2022  Pt to be independent with final HEP  Goal status: INITIAL  2.  Pt to demo full ROM for lumbar spine to be [facility] and pain free.   Goal status: INITIAL  3.  Pt to report decreased pain in back and LE to 0-2/10 with activity   Goal status: INITIAL  4.  Pt to demo improved ability for transfers, stairs, and gait without pain, to improve ability for IADLs and community activity.    Goal status: INITIAL  5. Pt to demo ability for bend, lift, squat motion with optimal mechanics, and no pain in back, to improve ability and pain with IADLs.     PLAN:  PT FREQUENCY: 1-2x/week  PT DURATION: 8 weeks  PLANNED INTERVENTIONS: Therapeutic exercises, Therapeutic activity, Neuromuscular re-education, Patient/Family education, Self Care, Joint mobilization, Joint manipulation, Stair training, Orthotic/Fit training, DME instructions, Aquatic Therapy, Dry Needling, Electrical stimulation, Cryotherapy, Moist heat, Taping, Ultrasound, Ionotophoresis 4mg /ml Dexamethasone, Manual therapy,  Vasopneumatic device, Traction, Spinal manipulation, Spinal mobilization,Balance training, Gait training,   PLAN FOR NEXT SESSION:  hip abd- standing, core strength, lumbar mobility, STM- R lumbar    Sedalia Muta, PT, DPT 9:39 AM  11/02/22

## 2022-11-03 ENCOUNTER — Encounter: Payer: Self-pay | Admitting: Physical Therapy

## 2022-11-04 DIAGNOSIS — J432 Centrilobular emphysema: Secondary | ICD-10-CM | POA: Diagnosis not present

## 2022-11-04 DIAGNOSIS — F17210 Nicotine dependence, cigarettes, uncomplicated: Secondary | ICD-10-CM | POA: Diagnosis not present

## 2022-11-09 ENCOUNTER — Encounter: Payer: Self-pay | Admitting: Physical Therapy

## 2022-11-09 ENCOUNTER — Ambulatory Visit: Payer: Medicare PPO | Admitting: Physical Therapy

## 2022-11-09 DIAGNOSIS — M5459 Other low back pain: Secondary | ICD-10-CM | POA: Diagnosis not present

## 2022-11-09 NOTE — Therapy (Signed)
OUTPATIENT PHYSICAL THERAPY LOWER EXTREMITY TREATMENT   Patient Name: Jacob Frost MRN: [id number] DOB:Jun 19, 1955, 67 y.o., male Today's Date: 11/09/2022  END OF SESSION:  PT End of Session - 11/09/22 1351     Visit Number 8    Number of Visits 16    Date for PT Re-Evaluation 11/17/22    Authorization Type [employer]    PT Start Time 1107    PT Stop Time 1146    PT Time Calculation (min) 39 min    Activity Tolerance Patient tolerated treatment well;Patient limited by pain    Behavior During Therapy WFL for tasks assessed/performed               Past Medical History:  Diagnosis Date   Aortic atherosclerosis (HCC)    COPD (chronic obstructive pulmonary disease) (HCC)    Emphysema of lung (HCC)    Hyperlipidemia    Hypertension    Past Surgical History:  Procedure Laterality Date   CARDIAC CATHETERIZATION     TONSILLECTOMY     WISDOM TOOTH EXTRACTION     Patient Active Problem List   Diagnosis Date Noted   Coronary artery disease 02/20/2022   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   Hyperglycemia 02/04/2021   Pulmonary nodules 01/31/2021   Insomnia 02/22/2020   Psoriasis 02/22/2020   Aortic atherosclerosis (HCC)    History of smoking 30 or more pack years 10/20/2017   ERECTILE DYSFUNCTION, 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    SUBJECTIVE:   SUBJECTIVE STATEMENT:  11/09/2022 R shin still feels numb at times. Feels he is walking much better.  R knee is no longer sore. Feels R leg is getting a little stronger. Has had little to no pain in his back.   Pt states minimal  pain in the last couple days. Thinks back is doing much better. States pain in lateral side of R knee, which is newer.   Eval: Pt reports Newer onset of R low back pain, about 2-3 weeks ago. No injury to report, no previous pain.  Wasn't that bad to start, but then worsened. Pain radiating into R LE/anterior thigh and into groin. No pain past knee.  Using cane now because of back pain, was not using prior.   Pt is retired, likes to be active at home.  Most pain:  sitting starts pain, and it eases a bit if he gets up and walks around. Also first thing in am.  R side pain, none on L.  Did have round of prednisone, did not get any relief. Baclofen: does not seem to be  helping , but he does feel pain is maybe a bit better in last couple days.  Not doing any exercises for back or regular exercise in general , just active around/outside of house.  Stairs: no stairs.  Heating pad: worse.   PERTINENT HISTORY: COPD, HTN, emphysema,  PAIN:   Are you having pain? Yes: NPRS scale: 0-2 /10 Pain location: R low back  Pain description: sore Aggravating factors: sitting or standing too long, transfers.  Relieving factors: changing positions.   PRECAUTIONS: None  WEIGHT  BEARING RESTRICTIONS: No  FALLS:  Has patient fallen in last 6 months? No  PLOF: Independent  PATIENT GOALS:  Decreased pain   NEXT MD VISIT:   OBJECTIVE:     DIAGNOSTIC FINDINGS:   PATIENT SURVEYS:   FOTO:  eval:  39  Visit  5:   57.2  COGNITION: Overall cognitive status: Within functional limits for tasks assessed     SENSATION: WFL  EDEMA:   POSTURE:    Slight lateral shift to L to offload R.   PALPATION:  mild tenderness in low lumbar region, Less pain centrally, most pain in R glute  LOWER EXTREMITY ROM: Lumbar: flexion: mild limitation,  extension: WFL SB: mild limitation with  slight inc pain to the R.  Hips: WFL Knees: WFL   LOWER EXTREMITY MMT:  MMT Left eval Right  eval R 11/09/22  Hip  flexion 4 4 4+  Hip extension     Hip abduction  4-  pain 4  Hip adduction     Hip internal rotation     Hip external rotation  4-  pain 4  Knee flexion     Knee extension     Ankle dorsiflexion     Ankle plantarflexion     Ankle inversion     Ankle eversion      (Blank rows = not tested)  LOWER EXTREMITY SPECIAL TESTS:    FUNCTIONAL TESTS:    GAIT: Distance walked: 100 ft  Assistive device utilized: Single point cane Level of assistance: Modified independence Comments: antalgic gait    TODAY'S TREATMENT:                                                                                                                              DATE:   11/09/2022 Therapeutic Exercise: Aerobic: Supine:    bridging x 15;   SLR  10 bil;  S/L: hip abd x 10 bil;  (add clams) Seated:   sit to stand x10 with hip hinge ;   Standing:  hip abd (add ext next visit)  2 x 10 bil;  Marching x 20; heel raises x 20;  Step ups 6 in , 1 hand rail, x 10 bil; Stretches:   SKTC 20 sec x 2 bil;  Neuromuscular Re-education: Manual Therapy:    Therapeutic Activity: Self Care:    Therapeutic Exercise: Aerobic: Supine:    bridging x 15 Prone: hip ext x 10 bil;  S/L: hip abd x 10 bil;  Seated:   sit to stand x10 wit hip hinge  Standing:  hip abd 2 x 10 bil;  Marching x 20; heel raises x 20;  Stretches:  Seated HSS 30 sec x 3 bil;    SKTC 20 sec x 2 bil;  Neuromuscular Re-education: Manual Therapy:   light lumbar PA mobs, STM/DTM to R lumbar paraspinals.  Therapeutic Activity: Self Care:   Previous: LAQ- nerve glide   Therapeutic Exercise: Aerobic: Supine:  pelvic tilts x 15;   Piriformis stretch 30 sec x 2  bil;  Prone over 1 pillow x 2 min,  flat/prone x 1 min;  Prone on elbows x1 min,  prone press ups 2 x 10;  TA contraction x 15, education on achieving optimal contraction.  Seated: Standing:  lumbar extension 2 x 10 Stretches:  Neuromuscular Re-education: Manual Therapy:  long leg  distraction on R for lumbar,  light lumbar PA mobs.  Therapeutic Activity: Self Care:   Previous:  Therapeutic Exercise: Aerobic: Supine:  education on hooklying/decompression position ,  pelvic tilts x 15;  Piriformis stretch 30 sec x 2 bil;  Trial for prone laying- able to tolerate laying on 1 pillow, still sore, but better than laying flat/prone.  Seated: Standing: trial for R side glides- painful.  Stretches:  Neuromuscular Re-education: Manual Therapy: long leg distraction on R for lumbar  Therapeutic Activity: Self Care:   PATIENT EDUCATION:  Education details: updated and reviewed HEP.  Person educated: Patient Education method: Explanation, Demonstration, Tactile cues, Verbal cues, and Handouts Education comprehension: verbalized understanding, returned demonstration, verbal cues required, tactile cues required, and needs further education  HOME EXERCISE PROGRAM: Access Code: [email or web address]  ASSESSMENT:  CLINICAL IMPRESSION: 11/09/2022  Continued focus on strengthening for R LE today. Improving strength in leg overall seen with ambulation and ther ex today. Hip abd still weak with testing today. He has much improving pain levels. Plan to progress strength as tolerated.   Eval: Patient presents with primary complaint of pain in low back, radiating into R LE. Pt with symptoms of lumbar radiculopathy. He has Slight shift away from R in standing, due to increased pain with loading to R. He has antalgic gait and decreased tolerance for walking due to pain, and requires use of SPC at this time. He has poor movement quality , with decreased ROM and pain.  Pt with decreased ability for full functional activities, ADLS, and IADLs.  Pt will  benefit from skilled PT to improve deficits and pain and to return to PLOF.   OBJECTIVE IMPAIRMENTS: Abnormal gait, decreased activity tolerance, decreased mobility, decreased ROM, decreased strength, increased muscle spasms, and pain.   ACTIVITY  LIMITATIONS: lifting, bending, sitting, standing, squatting, sleeping, transfers, bed mobility, dressing, reach over head, hygiene/grooming, and locomotion level  PARTICIPATION LIMITATIONS: meal prep, cleaning, laundry, driving, shopping, community activity, and yard work  PERSONAL FACTORS:  none  are also affecting patient's functional outcome.   REHAB POTENTIAL: Good  CLINICAL DECISION MAKING: Stable/uncomplicated  EVALUATION COMPLEXITY: Low   GOALS: Goals reviewed with patient? Yes  SHORT TERM GOALS: Target date: 10/06/2022   Pt to be independent with initial HEP  Goal status: INITIAL  2.  Pt to report decreased pain in back and leg to 0-4/10   Goal status: INITIAL   LONG TERM GOALS: Target date: 11/17/2022  Pt to be independent with final HEP  Goal status: INITIAL  2.  Pt to demo full ROM for lumbar spine to be [facility] and pain free.   Goal status: INITIAL  3.  Pt to report decreased pain in back and LE to 0-2/10 with activity   Goal status: INITIAL  4.  Pt to demo improved ability for transfers, stairs, and gait without pain, to improve ability for IADLs and community activity.    Goal status: INITIAL  5. Pt to demo ability for bend, lift, squat motion with optimal mechanics, and no pain in back, to improve ability and pain with IADLs.  PLAN:  PT FREQUENCY: 1-2x/week  PT DURATION: 8 weeks  PLANNED INTERVENTIONS: Therapeutic exercises, Therapeutic activity, Neuromuscular re-education, Patient/Family education, Self Care, Joint mobilization, Joint manipulation, Stair training, Orthotic/Fit training, DME instructions, Aquatic Therapy, Dry Needling, Electrical stimulation, Cryotherapy, Moist heat, Taping, Ultrasound, Ionotophoresis 4mg /ml Dexamethasone, Manual therapy,  Vasopneumatic device, Traction, Spinal manipulation, Spinal mobilization,Balance training, Gait training,   PLAN FOR NEXT SESSION:  hip abd- standing, core strength, lumbar mobility, STM- R  lumbar    Sedalia Muta, PT, DPT 1:52 PM  11/09/22

## 2022-11-12 DIAGNOSIS — F17210 Nicotine dependence, cigarettes, uncomplicated: Secondary | ICD-10-CM | POA: Diagnosis not present

## 2022-11-12 DIAGNOSIS — J432 Centrilobular emphysema: Secondary | ICD-10-CM | POA: Diagnosis not present

## 2022-11-16 ENCOUNTER — Encounter: Payer: Self-pay | Admitting: Physical Therapy

## 2022-11-16 ENCOUNTER — Ambulatory Visit: Payer: Medicare PPO | Admitting: Physical Therapy

## 2022-11-16 DIAGNOSIS — M5459 Other low back pain: Secondary | ICD-10-CM | POA: Diagnosis not present

## 2022-11-16 NOTE — Therapy (Unsigned)
OUTPATIENT PHYSICAL THERAPY LOWER EXTREMITY TREATMENT   Patient Name: Jacob Frost MRN: [id number] DOB:10/04/1955, 67 y.o., male Today's Date: 11/16/2022  END OF SESSION:  PT End of Session - 11/17/22 1913     Visit Number 9    Number of Visits 16    Date for PT Re-Evaluation 12/28/22    Authorization Type [employer]    PT Start Time [phone]    PT Stop Time 1016    PT Time Calculation (min) 38 min    Activity Tolerance Patient tolerated treatment well;Patient limited by pain    Behavior During Therapy [facility] for tasks assessed/performed                 Past Medical History:  Diagnosis Date   Aortic atherosclerosis (HCC)    COPD (chronic obstructive pulmonary disease) (HCC)    Emphysema of lung (HCC)    Hyperlipidemia    Hypertension    Past Surgical History:  Procedure Laterality Date   CARDIAC CATHETERIZATION     TONSILLECTOMY     WISDOM TOOTH EXTRACTION     Patient Active Problem List   Diagnosis Date Noted   Coronary artery disease 02/20/2022   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   Hyperglycemia 02/04/2021   Pulmonary nodules 01/31/2021   Insomnia 02/22/2020   Psoriasis 02/22/2020   Aortic atherosclerosis (HCC)    History of smoking 30 or more pack years 10/20/2017   ERECTILE DYSFUNCTION, 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    SUBJECTIVE:   SUBJECTIVE STATEMENT:  11/07/2022 Has had little to no pain in his back. Has felt his R knee a few times. Thinks he is walking better.   Eval: Pt reports Newer onset of R low back pain, about 2-3 weeks ago. No injury to  report, no previous pain.  Wasn't that bad to start, but then worsened. Pain radiating into R LE/anterior thigh and into groin. No pain past knee.  Using cane now because of back pain, was not using prior.   Pt is retired, likes to be active at home.  Most pain:  sitting starts pain, and it eases a bit if he gets up and walks around. Also first thing in am.  R side pain, none on L.  Did have round of prednisone, did not get any relief. Baclofen: does not seem to be  helping , but he does feel pain is maybe a bit better in last couple days.  Not doing any exercises for back or regular exercise in general , just active around/outside of house.  Stairs: no stairs.  Heating pad: worse.   PERTINENT HISTORY: COPD, HTN, emphysema,  PAIN:   Are you having pain? Yes: NPRS scale: 0-2 /10 Pain location: R low back  Pain description: sore Aggravating factors: sitting or standing too long, transfers.  Relieving factors: changing positions.   PRECAUTIONS: None  WEIGHT BEARING RESTRICTIONS: No  FALLS:  Has patient fallen in last 6 months? No  PLOF: Independent  PATIENT GOALS:  Decreased pain   NEXT MD VISIT:   OBJECTIVE:     DIAGNOSTIC FINDINGS:   PATIENT SURVEYS:  FOTO:  eval:  39  Visit  5:   57.2  COGNITION: Overall cognitive status: Within functional limits for tasks assessed     SENSATION: WFL  EDEMA:   POSTURE:    Slight lateral shift to L to offload R.   PALPATION:  mild tenderness in low lumbar region, Less pain centrally, most pain in R glute  LOWER EXTREMITY ROM: Lumbar: flexion: mild limitation,  extension: WFL SB: mild limitation with  slight inc pain to the R.  Hips: WFL Knees: WFL   LOWER EXTREMITY MMT:  MMT Left eval Right  eval R 11/09/22  Hip flexion 4 4 4+  Hip extension     Hip abduction  4-  pain 4  Hip adduction     Hip internal rotation     Hip external rotation  4-  pain 4  Knee flexion     Knee extension     Ankle dorsiflexion      Ankle plantarflexion     Ankle inversion     Ankle eversion      (Blank rows = not tested)  LOWER EXTREMITY SPECIAL TESTS:    FUNCTIONAL TESTS:    GAIT: Distance walked: 100 ft  Assistive device utilized: Single point cane Level of assistance: Modified independence Comments: antalgic gait    TODAY'S TREATMENT:                                                                                                                              DATE:   11/16/2022 Therapeutic Exercise: Aerobic: Supine:    bridging 2 x 5;   SLR  2 x 10 bil;  S/L: hip abd 2 x 10 bil;   S/L clams 2 x 10 bil;  Seated:    Standing: Squats at mat table x 10;  hip abd and ext   x 10 ea bil;   Step ups 6 in , 1 hand rail, x 10 bil; Stretches:   SKTC 20 sec x 3  bil;  Neuromuscular Re-education: Manual Therapy:    Therapeutic Activity: Self Care:   Therapeutic Exercise: Aerobic: Supine:    bridging x 15;   SLR  10 bil;  S/L: hip abd x 10 bil;  (add clams) Seated:   sit to stand x10 with hip hinge ;   Standing:  hip abd (add ext next visit)  2 x 10 bil;  Marching x 20; heel raises x 20;  Step ups 6 in , 1 hand rail, x 10 bil; Stretches:   SKTC 20 sec x 2 bil;  Neuromuscular Re-education: Manual Therapy:    Therapeutic Activity: Self Care:   Therapeutic Exercise: Aerobic: Supine:    bridging x 15 Prone: hip ext x 10 bil;  S/L: hip abd x 10 bil;  Seated:   sit to stand x10 wit hip hinge  Standing:  hip abd 2 x 10 bil;  Marching x 20; heel raises x 20;  Stretches:  Seated HSS 30 sec x 3 bil;    SKTC 20 sec x 2 bil;  Neuromuscular Re-education: Manual Therapy:   light lumbar PA mobs, STM/DTM to R lumbar paraspinals.  Therapeutic Activity: Self Care:    PATIENT EDUCATION:  Education details: updated and reviewed HEP.  Person educated: Patient Education method: Explanation, Demonstration, Tactile cues, Verbal cues, and Handouts Education comprehension: verbalized understanding, returned  demonstration, verbal cues required, tactile cues required, and needs further education  HOME EXERCISE PROGRAM: Access Code: [email or web address]  ASSESSMENT:  CLINICAL IMPRESSION: 11/17/2022  Continued focus on strengthening for R LE today. He is showing improvements with ability for strengthening with ther ex.  His gait is much improved, with improving stability in R hip and less trunk  compensation. Pt to benefit from continued care, Plan to progress LE as tolerated.   Eval: Patient presents with primary complaint of pain in low back, radiating into R LE. Pt with symptoms of lumbar radiculopathy. He has Slight shift away from R in standing, due to increased pain with loading to R. He has antalgic gait and decreased tolerance for walking due to pain, and requires use of SPC at this time. He has poor movement quality , with decreased ROM and pain.  Pt with decreased ability for full functional activities, ADLS, and IADLs.  Pt will  benefit from skilled PT to improve deficits and pain and to return to PLOF.   OBJECTIVE IMPAIRMENTS: Abnormal gait, decreased activity tolerance, decreased mobility, decreased ROM, decreased strength, increased muscle spasms, and pain.   ACTIVITY LIMITATIONS: lifting, bending, sitting, standing, squatting, sleeping, transfers, bed mobility, dressing, reach over head, hygiene/grooming, and locomotion level  PARTICIPATION LIMITATIONS: meal prep, cleaning, laundry, driving, shopping, community activity, and yard work  PERSONAL FACTORS:  none  are also affecting patient's functional outcome.   REHAB POTENTIAL: Good  CLINICAL DECISION MAKING: Stable/uncomplicated  EVALUATION COMPLEXITY: Low   GOALS: Goals reviewed with patient? Yes  SHORT TERM GOALS: Target date: 10/06/2022   Pt to be independent with initial HEP  Goal status: MET  2.  Pt to report decreased pain in back and leg to 0-4/10   Goal status: MET   LONG TERM GOALS: Target date: 12/28/2022  Pt to be  independent with final HEP  Goal status: partially MET  2.  Pt to demo full ROM for lumbar spine to be [facility] and pain free.   Goal status: MET  3.  Pt to report decreased pain in back and LE to 0-2/10 with activity   Goal status: partially MET  4.  Pt to demo improved ability for transfers, stairs, and gait without pain, to improve ability for IADLs and community activity.   Goal status: MET  5. Pt to demo ability for bend, lift, squat motion with optimal mechanics, and no pain in back, to improve ability and pain with IADLs.   Goal status: partially MET   PLAN:  PT FREQUENCY: 1-2x/week  PT DURATION: 8 weeks  PLANNED INTERVENTIONS: Therapeutic exercises, Therapeutic activity, Neuromuscular re-education, Patient/Family education, Self Care, Joint mobilization, Joint manipulation, Stair training, Orthotic/Fit training, DME instructions, Aquatic Therapy, Dry Needling, Electrical stimulation, Cryotherapy, Moist heat, Taping, Ultrasound, Ionotophoresis 4mg /ml Dexamethasone, Manual therapy,  Vasopneumatic device, Traction, Spinal manipulation, Spinal mobilization,Balance training, Gait training,   PLAN FOR NEXT SESSION:  hip abd- standing, core strength, lumbar mobility, STM- R lumbar    Sedalia Muta, PT, DPT 7:13 PM  11/17/22

## 2022-11-23 ENCOUNTER — Ambulatory Visit: Payer: Medicare PPO | Admitting: Physical Therapy

## 2022-11-23 ENCOUNTER — Encounter: Payer: Self-pay | Admitting: Physical Therapy

## 2022-11-23 DIAGNOSIS — M5459 Other low back pain: Secondary | ICD-10-CM | POA: Diagnosis not present

## 2022-11-23 NOTE — Therapy (Signed)
OUTPATIENT PHYSICAL THERAPY LOWER EXTREMITY TREATMENT/Progress Note   Patient Name: Jacob Frost MRN: [id number] DOB:1955-09-08, 67 y.o., male Today's Date: 11/23/2022  Physical Therapy Progress Note  Dates of Reporting Period: 09/22/22 to 11/23/22    END OF SESSION:  PT End of Session - 11/23/22 1308     Visit Number 10    Number of Visits 16    Date for PT Re-Evaluation 12/28/22    Authorization Type [employer]    PT Start Time 1308    PT Stop Time 1350    PT Time Calculation (min) 42 min    Activity Tolerance Patient tolerated treatment well;No increased pain    Behavior During Therapy WFL for tasks assessed/performed                  Past Medical History:  Diagnosis Date   Aortic atherosclerosis (HCC)    COPD (chronic obstructive pulmonary disease) (HCC)    Emphysema of lung (HCC)    Hyperlipidemia    Hypertension    Past Surgical History:  Procedure Laterality Date   CARDIAC CATHETERIZATION     TONSILLECTOMY     WISDOM TOOTH EXTRACTION     Patient Active Problem List   Diagnosis Date Noted   Coronary artery disease 02/20/2022   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   Hyperglycemia 02/04/2021   Pulmonary nodules 01/31/2021   Insomnia 02/22/2020   Psoriasis 02/22/2020   Aortic atherosclerosis (HCC)    History of smoking 30 or more pack years 10/20/2017   ERECTILE DYSFUNCTION, 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    SUBJECTIVE:   SUBJECTIVE STATEMENT: 11/23/2022 Pt states he is overall feeling better and no longer has numbness down his R knee or into his  toes and has decreased pain in his lower back. Pt is able to mow his lawn without difficulty.   Eval: Pt reports Newer onset of R low back pain, about 2-3 weeks ago. No injury to report, no previous pain.  Wasn't that bad to start, but then worsened. Pain radiating into R LE/anterior thigh and into groin. No pain past knee.  Using cane now because of back pain, was not using prior.   Pt is retired, likes to be active at home.  Most pain:  sitting starts pain, and it eases a bit if he gets up and walks around. Also first thing in am.  R side pain, none on L.  Did have round of prednisone, did not get any relief. Baclofen: does not seem to be  helping , but he does feel pain is maybe a bit better in last couple days.  Not doing any exercises for back or regular exercise in general , just active around/outside of house.  Stairs: no stairs.  Heating pad: worse.   PERTINENT HISTORY: COPD, HTN, emphysema,  PAIN:  Are you having pain? Yes: NPRS scale: 0/10 Pain location: R low back  Pain description: sore  Aggravating factors: sitting or standing too long, transfers.  Relieving factors: changing positions.   PRECAUTIONS: None  WEIGHT BEARING RESTRICTIONS: No  FALLS:  Has patient fallen in last 6  months? No  PLOF: Independent  PATIENT GOALS:  Decreased pain   NEXT MD VISIT:   OBJECTIVE:     DIAGNOSTIC FINDINGS:   PATIENT SURVEYS:   FOTO:  eval:  39  Visit  5:   57.2   COGNITION: Overall cognitive status: Within functional limits for tasks assessed     SENSATION: WFL  EDEMA:   POSTURE:     PALPATION:  mild tenderness in low lumbar region, Less pain centrally, most pain in R glute  LOWER EXTREMITY ROM: Lumbar: WFL SB: WFL Hips: WFL Knees: WFL   LOWER EXTREMITY MMT:  MMT Left eval Right  eval R 11/09/22  Hip flexion 4 4 4+  Hip extension     Hip abduction  4-  pain 4  Hip adduction     Hip internal rotation     Hip external rotation  4-  pain 4  Knee  flexion     Knee extension     Ankle dorsiflexion     Ankle plantarflexion     Ankle inversion     Ankle eversion      (Blank rows = not tested)  LOWER EXTREMITY SPECIAL TESTS:    FUNCTIONAL TESTS:    TODAY'S TREATMENT:                                                                                                                              DATE:   11/23/2022 Therapeutic Exercise: Aerobic: Supine:    bridging 2 x 10;   SLR  2 x 10 bil;  S/L:  Seated:    Standing: Squats at mat table x 15;  hip ext  x 10 ea bil;   Step ups 6 in, x 10 bil; side steps x3 laps of 15 ft; Paloff press RTB 2x8; Stretches:   Neuromuscular Re-education: Manual Therapy:    Therapeutic Activity: Self Care:   11/16/2022 Therapeutic Exercise: Aerobic: Supine:    bridging 2 x 5;   SLR  2 x 10 bil;  S/L: hip abd 2 x 10 bil;   S/L clams 2 x 10 bil;  Seated:    Standing: Squats at mat table x 10;  hip abd and ext   x 10 ea bil;   Step ups 6 in , 1 hand rail, x 10 bil; Stretches:   SKTC 20 sec x 3  bil;  Neuromuscular Re-education: Manual Therapy:    Therapeutic Activity: Self Care:    PATIENT EDUCATION:  Education details: updated and reviewed HEP.  Person educated: Patient Education method: Explanation, Demonstration, Tactile cues, Verbal cues, and Handouts Education comprehension: verbalized understanding, returned demonstration, verbal cues required, tactile cues required, and needs further education  HOME EXERCISE PROGRAM: Access Code: [email or web address]  ASSESSMENT:  CLINICAL IMPRESSION: 11/23/2022  Session focused on R LE and lumbopelvic strengthening to promote continued return to ADLs. Pt able to progress ther ex by increasing reps during glute and quad dominant interventions. Pt's pain,  mobility, and gait are steadily improving as shown through increased LE strength, stability, and reduced trunk compensation during gait. Pt with mild R knee pain to palpate lateral joint line. He states  intermittent pain, but minimal pain during session today. Recommended he f/u with sports med in future if needed for knee pain. Pt progressing well overall, will be seen for 1 more visit, to finalize HEP and continue strength for R LE. Likely d/c next visit.    Eval: Patient presents with primary complaint of pain in low back, radiating into R LE. Pt with symptoms of lumbar radiculopathy. He has Slight shift away from R in standing, due to increased pain with loading to R. He has antalgic gait and decreased tolerance for walking due to pain, and requires use of SPC at this time. He has poor movement quality , with decreased ROM and pain.  Pt with decreased ability for full functional activities, ADLS, and IADLs.  Pt will  benefit from skilled PT to improve deficits and pain and to return to PLOF.   OBJECTIVE IMPAIRMENTS: Abnormal gait, decreased activity tolerance, decreased mobility, decreased ROM, decreased strength, increased muscle spasms, and pain.   ACTIVITY LIMITATIONS: lifting, bending, sitting, standing, squatting, sleeping, transfers, bed mobility, dressing, reach over head, hygiene/grooming, and locomotion level  PARTICIPATION LIMITATIONS: meal prep, cleaning, laundry, driving, shopping, community activity, and yard work  PERSONAL FACTORS:  none  are also affecting patient's functional outcome.   REHAB POTENTIAL: Good  CLINICAL DECISION MAKING: Stable/uncomplicated  EVALUATION COMPLEXITY: Low   GOALS: Goals reviewed with patient? Yes  SHORT TERM GOALS: Target date: 10/06/2022   Pt to be independent with initial HEP  Goal status: MET  2.  Pt to report decreased pain in back and leg to 0-4/10   Goal status: MET   LONG TERM GOALS: Target date: 12/28/2022  Pt to be independent with final HEP  Goal status: partially MET  2.  Pt to demo full ROM for lumbar spine to be [facility] and pain free.   Goal status: MET  3.  Pt to report decreased pain in back and LE to 0-2/10 with  activity   Goal status: MET  4.  Pt to demo improved ability for transfers, stairs, and gait without pain, to improve ability for IADLs and community activity.   Goal status: MET  5. Pt to demo ability for bend, lift, squat motion with optimal mechanics, and no pain in back, to improve ability and pain with IADLs.   Goal status: partially MET   PLAN:  PT FREQUENCY: 1-2x/week  PT DURATION: 8 weeks  PLANNED INTERVENTIONS: Therapeutic exercises, Therapeutic activity, Neuromuscular re-education, Patient/Family education, Self Care, Joint mobilization, Joint manipulation, Stair training, Orthotic/Fit training, DME instructions, Aquatic Therapy, Dry Needling, Electrical stimulation, Cryotherapy, Moist heat, Taping, Ultrasound, Ionotophoresis 4mg /ml Dexamethasone, Manual therapy,  Vasopneumatic device, Traction, Spinal manipulation, Spinal mobilization,Balance training, Gait training,   PLAN FOR NEXT SESSION: possible d/c, f/u foto    Sedalia Muta, PT, DPT 2:28 PM  11/23/22

## 2022-11-30 ENCOUNTER — Encounter: Payer: Self-pay | Admitting: Physical Therapy

## 2022-11-30 ENCOUNTER — Ambulatory Visit: Payer: Medicare PPO | Admitting: Physical Therapy

## 2022-11-30 DIAGNOSIS — M5459 Other low back pain: Secondary | ICD-10-CM

## 2022-11-30 NOTE — Therapy (Signed)
OUTPATIENT PHYSICAL THERAPY LOWER EXTREMITY TREATMENT/ Discharge   Patient Name: Jacob Frost MRN: [id number] DOB:[birth year], 67 y.o., male Today's Date: 11/30/2022   END OF SESSION:  PT End of Session - 11/30/22 0932     Visit Number 11    Number of Visits 16    Date for PT Re-Evaluation 12/28/22    Authorization Type [employer]  PN done at visit 10    PT Start Time 0932    PT Stop Time 1014    PT Time Calculation (min) 42 min    Activity Tolerance Patient tolerated treatment well;No increased pain    Behavior During Therapy WFL for tasks assessed/performed                   Past Medical History:  Diagnosis Date   Aortic atherosclerosis (HCC)    COPD (chronic obstructive pulmonary disease) (HCC)    Emphysema of lung (HCC)    Hyperlipidemia    Hypertension    Past Surgical History:  Procedure Laterality Date   CARDIAC CATHETERIZATION     TONSILLECTOMY     WISDOM TOOTH EXTRACTION     Patient Active Problem List   Diagnosis Date Noted   Coronary artery disease 02/20/2022   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   Hyperglycemia 02/04/2021   Pulmonary nodules 01/31/2021   Insomnia 02/22/2020   Psoriasis 02/22/2020   Aortic atherosclerosis (HCC)    History of smoking 30 or more pack years 10/20/2017   ERECTILE DYSFUNCTION, 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  Rationale for Evaluation and Treatment: Rehabilitation  ONSET DATE: 2-3 weeks ago.    SUBJECTIVE:   SUBJECTIVE STATEMENT: 11/30/2022 Pt states he is able to complete all ADLs without discomfort. States he does have some pain in his R knee but it's much less than when he began.  Eval: Pt  reports Newer onset of R low back pain, about 2-3 weeks ago. No injury to report, no previous pain.  Wasn't that bad to start, but then worsened. Pain radiating into R LE/anterior thigh and into groin. No pain past knee.  Using cane now because of back pain, was not using prior.   Pt is retired, likes to be active at home.  Most pain:  sitting starts pain, and it eases a bit if he gets up and walks around. Also first thing in am.  R side pain, none on L.  Did have round of prednisone, did not get any relief. Baclofen: does not seem to be  helping , but he does feel pain is maybe a bit better in last couple days.  Not doing any exercises for back or regular exercise in general , just active around/outside of house.  Stairs: no stairs.  Heating pad: worse.   PERTINENT HISTORY: COPD, HTN, emphysema,  PAIN:  Are you having pain? Yes: NPRS scale: 0/10 Pain location: R low back  Pain description: sore  Aggravating factors: sitting or standing too long, transfers.  Relieving factors: changing positions.   PRECAUTIONS: None  WEIGHT BEARING RESTRICTIONS: No  FALLS:  Has patient fallen in last 6 months? No  PLOF: Independent  PATIENT GOALS:  Decreased pain   NEXT MD VISIT:  OBJECTIVE:     DIAGNOSTIC FINDINGS:   PATIENT SURVEYS:   FOTO:  eval:  39  Visit  5:   57.2 Visit 11: 94 (11/30/22)   COGNITION: Overall cognitive status: Within functional limits for tasks assessed     SENSATION: WFL  EDEMA:   POSTURE:     PALPATION:  mild tenderness in low lumbar region, Less pain centrally, most pain in R glute  LOWER EXTREMITY ROM: Lumbar: WFL SB: WFL Hips: WFL Knees: WFL   LOWER EXTREMITY MMT:  MMT Left eval Right  eval R 11/09/22  Hip flexion 4 4 4+  Hip extension     Hip abduction  4-  pain 4  Hip adduction     Hip internal rotation     Hip external rotation  4-  pain 4  Knee flexion     Knee extension     Ankle dorsiflexion     Ankle plantarflexion      Ankle inversion     Ankle eversion      (Blank rows = not tested)  LOWER EXTREMITY SPECIAL TESTS:    FUNCTIONAL TESTS:    TODAY'S TREATMENT:                                                                                                                              DATE:   11/30/2022 Therapeutic Exercise: Aerobic: Supine:  Bridging 10x5";  SLR  2 x 10 bil;  S/L:  Seated:    Standing: Squats x 10;  Box lifts at table with 18# box x10-12 Step ups 6 in, x 10 bil; side steps x3 laps of 15 ft; Paloff press RTB 2x8; Stretches:  Piriformis stretch R LE x45" Neuromuscular Re-education: Manual Therapy:    Therapeutic Activity: Self Care:  Previous Therapeutic Exercise: Aerobic: Supine:    bridging 2 x 10;   SLR  2 x 10 bil;  S/L:  Seated:    Standing: Squats at mat table x 15;  hip ext  x 10 ea bil;   Step ups 6 in, x 10 bil; side steps x3 laps of 15 ft; Paloff press RTB 2x8; Stretches:   Neuromuscular Re-education: Manual Therapy:    Therapeutic Activity: Self Care: 11/16/2022 Therapeutic Exercise: Aerobic: Supine:    bridging 2 x 5;   SLR  2 x 10 bil;  S/L: hip abd 2 x 10 bil;   S/L clams 2 x 10 bil;  Seated:    Standing: Squats at mat table x 10;  hip abd and ext   x 10 ea bil;   Step ups 6 in , 1 hand rail, x 10 bil; Stretches:   SKTC 20 sec x 3  bil;  Neuromuscular Re-education: Manual Therapy:    Therapeutic Activity: Self Care:    PATIENT EDUCATION:  Education details: updated and reviewed HEP.  Person educated: Patient Education method: Explanation, Demonstration, Tactile cues, Verbal cues, and Handouts Education comprehension: verbalized understanding, returned  demonstration, verbal cues required, tactile cues required, and needs further education  HOME EXERCISE PROGRAM: Access Code: [email or web address]  ASSESSMENT:  CLINICAL IMPRESSION: 11/30/2022  Session focused on R LE and lumbopelvic strengthening and functional activity training. Pt can perform all ADLs  without modification or pain from his chief complaint. Pt performed each intervention with appropriate body mechanics, control, and speed-all without pain.  Pt provided recommendation of f/u with sports med if needed for his R knee pain and HEP was finalized. Pt has met all goals and agrees with PT recommendation of D/C and management with HEP.   Eval: Patient presents with primary complaint of pain in low back, radiating into R LE. Pt with symptoms of lumbar radiculopathy. He has Slight shift away from R in standing, due to increased pain with loading to R. He has antalgic gait and decreased tolerance for walking due to pain, and requires use of SPC at this time. He has poor movement quality , with decreased ROM and pain.  Pt with decreased ability for full functional activities, ADLS, and IADLs.  Pt will  benefit from skilled PT to improve deficits and pain and to return to PLOF.   OBJECTIVE IMPAIRMENTS: Abnormal gait, decreased activity tolerance, decreased mobility, decreased ROM, decreased strength, increased muscle spasms, and pain.   ACTIVITY LIMITATIONS: lifting, bending, sitting, standing, squatting, sleeping, transfers, bed mobility, dressing, reach over head, hygiene/grooming, and locomotion level  PARTICIPATION LIMITATIONS: meal prep, cleaning, laundry, driving, shopping, community activity, and yard work  PERSONAL FACTORS:  none  are also affecting patient's functional outcome.   REHAB POTENTIAL: Good  CLINICAL DECISION MAKING: Stable/uncomplicated  EVALUATION COMPLEXITY: Low   GOALS: Goals reviewed with patient? Yes  SHORT TERM GOALS: Target date: 10/06/2022   Pt to be independent with initial HEP  Goal status: MET  2.  Pt to report decreased pain in back and leg to 0-4/10   Goal status: MET   LONG TERM GOALS: Target date: 12/28/2022  Pt to be independent with final HEP  Goal status: MET  2.  Pt to demo full ROM for lumbar spine to be [facility] and pain free.   Goal  status: MET  3.  Pt to report decreased pain in back and LE to 0-2/10 with activity   Goal status: MET  4.  Pt to demo improved ability for transfers, stairs, and gait without pain, to improve ability for IADLs and community activity.   Goal status: MET  5. Pt to demo ability for bend, lift, squat motion with optimal mechanics, and no pain in back, to improve ability and pain with IADLs.   Goal status: MET   PLAN:  PT FREQUENCY: 1-2x/week  PT DURATION: 8 weeks  PLANNED INTERVENTIONS: Therapeutic exercises, Therapeutic activity, Neuromuscular re-education, Patient/Family education, Self Care, Joint mobilization, Joint manipulation, Stair training, Orthotic/Fit training, DME instructions, Aquatic Therapy, Dry Needling, Electrical stimulation, Cryotherapy, Moist heat, Taping, Ultrasound, Ionotophoresis 4mg /ml Dexamethasone, Manual therapy,  Vasopneumatic device, Traction, Spinal manipulation, Spinal mobilization,Balance training, Gait training,   PLAN FOR NEXT SESSION: Patient will be discharged   [place], SPT   This entire session was performed under direct supervision and direction of a licensed Estate agent . I have personally read, edited and approve of the note as written.   Sedalia Muta, PT, DPT 11:42 AM  11/30/22   PHYSICAL THERAPY DISCHARGE SUMMARY  Visits from Start of Care: 11   Plan: Patient agrees to discharge.  Patient goals were met. Patient is being discharged due to  meeting the stated rehab goals.     Sedalia Muta, PT, DPT 11:59 AM  11/30/22

## 2022-12-08 ENCOUNTER — Other Ambulatory Visit (HOSPITAL_BASED_OUTPATIENT_CLINIC_OR_DEPARTMENT_OTHER): Payer: Self-pay | Admitting: Pulmonary Disease

## 2022-12-08 DIAGNOSIS — R911 Solitary pulmonary nodule: Secondary | ICD-10-CM

## 2023-01-14 ENCOUNTER — Other Ambulatory Visit: Payer: Self-pay | Admitting: *Deleted

## 2023-01-15 ENCOUNTER — Telehealth: Payer: Self-pay | Admitting: *Deleted

## 2023-01-15 ENCOUNTER — Other Ambulatory Visit: Payer: Self-pay | Admitting: *Deleted

## 2023-01-15 MED ORDER — LISINOPRIL 5 MG PO TABS: 5.0000 mg | ORAL_TABLET | Freq: Every day | ORAL | 1 refills | Status: DC

## 2023-01-15 NOTE — Telephone Encounter (Signed)
Copied from CRM [id number]. Topic: Clinical - Medication Refill >> Jan 15, 2023 11:48 AM Tiffany H wrote: Most Recent Primary Care Visit:  Provider: Ardith Dark  Department: [facility]  Visit Type: OFFICE VISIT  Date: 09/07/2022  Medication: lisinopril (ZESTRIL) 5 MG tablet  Has the patient contacted their pharmacy? Yes (Agent: If no, request that the patient contact the pharmacy for the refill. If patient does not wish to contact the pharmacy document the reason why and proceed with request.) (Agent: If yes, when and what did the pharmacy advise?)  Is this the correct pharmacy for this prescription? Yes If no, delete pharmacy and type the correct one.  This is the patient's preferred pharmacy:   [facility] [address], [place], [address] Phone: [phone]  Fax: [phone]   Has the prescription been filled recently? No  Is the patient out of the medication? No  Has the patient been seen for an appointment in the last year OR does the patient have an upcoming appointment? Yes  Can we respond through MyChart? Yes  Agent: Please be advised that Rx refills may take up to 3 business days. We ask that you follow-up with your pharmacy.   Rx send to [facility] pharmacy

## 2023-02-08 ENCOUNTER — Encounter: Payer: Self-pay | Admitting: Family Medicine

## 2023-02-08 ENCOUNTER — Ambulatory Visit (INDEPENDENT_AMBULATORY_CARE_PROVIDER_SITE_OTHER): Payer: Medicare PPO | Admitting: Family Medicine

## 2023-02-08 VITALS — BP 138/83 | HR 70 | Temp 97.7°F | Ht 69.0 in | Wt 225.6 lb

## 2023-02-08 DIAGNOSIS — Z125 Encounter for screening for malignant neoplasm of prostate: Secondary | ICD-10-CM

## 2023-02-08 DIAGNOSIS — Z0001 Encounter for general adult medical examination with abnormal findings: Secondary | ICD-10-CM

## 2023-02-08 DIAGNOSIS — I10 Essential (primary) hypertension: Secondary | ICD-10-CM | POA: Diagnosis not present

## 2023-02-08 DIAGNOSIS — R739 Hyperglycemia, unspecified: Secondary | ICD-10-CM

## 2023-02-08 DIAGNOSIS — J449 Chronic obstructive pulmonary disease, unspecified: Secondary | ICD-10-CM | POA: Diagnosis not present

## 2023-02-08 DIAGNOSIS — E782 Mixed hyperlipidemia: Secondary | ICD-10-CM

## 2023-02-08 DIAGNOSIS — G4700 Insomnia, unspecified: Secondary | ICD-10-CM

## 2023-02-08 LAB — COMPREHENSIVE METABOLIC PANEL
ALT: 55 U/L — ABNORMAL HIGH (ref 0–53)
AST: 31 U/L (ref 0–37)
Albumin: 4.4 g/dL (ref 3.5–5.2)
Alkaline Phosphatase: 147 U/L — ABNORMAL HIGH (ref 39–117)
BUN: 14 mg/dL (ref 6–23)
CO2: 30 meq/L (ref 19–32)
Calcium: 10 mg/dL (ref 8.4–10.5)
Chloride: 105 meq/L (ref 96–112)
Creatinine, Ser: 0.83 mg/dL (ref 0.40–1.50)
GFR: 90.02 mL/min (ref >60.00)
Glucose, Bld: 102 mg/dL — ABNORMAL HIGH (ref 70–99)
Potassium: 4.9 meq/L (ref 3.5–5.1)
Sodium: 140 meq/L (ref 135–145)
Total Bilirubin: 0.6 mg/dL (ref 0.2–1.2)
Total Protein: 6.7 g/dL (ref 6.0–8.3)

## 2023-02-08 LAB — PSA: PSA: 2.04 ng/mL (ref 0.10–4.00)

## 2023-02-08 LAB — LIPID PANEL
Cholesterol: 119 mg/dL (ref 0–200)
HDL: 28 mg/dL — ABNORMAL LOW (ref >39.00)
LDL Cholesterol: 69 mg/dL (ref 0–99)
NonHDL: 91.29
Total CHOL/HDL Ratio: 4
Triglycerides: 111 mg/dL (ref 0.0–149.0)
VLDL: 22.2 mg/dL (ref 0.0–40.0)

## 2023-02-08 LAB — CBC
HCT: 45.9 % (ref 39.0–52.0)
Hemoglobin: 15.5 g/dL (ref 13.0–17.0)
MCHC: 33.7 g/dL (ref 30.0–36.0)
MCV: 92.3 fL (ref 78.0–100.0)
Platelets: 198 10*3/uL (ref 150.0–400.0)
RBC: 4.97 Mil/uL (ref 4.22–5.81)
RDW: 13.8 % (ref 11.5–15.5)
WBC: 6.8 10*3/uL (ref 4.0–10.5)

## 2023-02-08 LAB — TSH: TSH: 1.16 u[IU]/mL (ref 0.35–5.50)

## 2023-02-08 LAB — HEMOGLOBIN A1C: Hgb A1c MFr Bld: 6.1 % (ref 4.6–6.5)

## 2023-02-08 MED ORDER — ATORVASTATIN CALCIUM 80 MG PO TABS: 80.0000 mg | ORAL_TABLET | Freq: Every day | ORAL | 3 refills | Status: DC

## 2023-02-08 NOTE — Patient Instructions (Addendum)
 It was very nice to see you today!  We will check blood work today.  Please continue to work on diet and exercise.  I will refill your Lipitor.  Return in about 6 months (around 08/08/2023) for Follow Up.   Take care, Dr Kennyth  PLEASE NOTE:  If you had any lab tests, please let us  know if you have not heard back within a few days. You may see your results on mychart before we have a chance to review them but we will give you a call once they are reviewed by us .   If we ordered any referrals today, please let us  know if you have not heard from their office within the next week.   If you had any urgent prescriptions sent in today, please check with the pharmacy within an hour of our visit to make sure the prescription was transmitted appropriately.   Please try these tips to maintain a healthy lifestyle:  Eat at least 3 REAL meals and 1-2 snacks per day.  Aim for no more than 5 hours between eating.  If you eat breakfast, please do so within one hour of getting up.   Each meal should contain half fruits/vegetables, one quarter protein, and one quarter carbs (no bigger than a computer mouse)  Cut down on sweet beverages. This includes juice, soda, and sweet tea.   Drink at least 1 glass of water with each meal and aim for at least 8 glasses per day  Exercise at least 150 minutes every week.    Preventive Care 60 Years and Older, Male Preventive care refers to lifestyle choices and visits with your health care provider that can promote health and wellness. Preventive care visits are also called wellness exams. What can I expect for my preventive care visit? Counseling During your preventive care visit, your health care provider may ask about your: Medical history, including: Past medical problems. Family medical history. History of falls. Current health, including: Emotional well-being. Home life and relationship well-being. Sexual activity. Memory and ability to understand  (cognition). Lifestyle, including: Alcohol, nicotine or tobacco, and drug use. Access to firearms. Diet, exercise, and sleep habits. Work and work astronomer. Sunscreen use. Safety issues such as seatbelt and bike helmet use. Physical exam Your health care provider will check your: Height and weight. These may be used to calculate your BMI (body mass index). BMI is a measurement that tells if you are at a healthy weight. Waist circumference. This measures the distance around your waistline. This measurement also tells if you are at a healthy weight and may help predict your risk of certain diseases, such as type 2 diabetes and high blood pressure. Heart rate and blood pressure. Body temperature. Skin for abnormal spots. What immunizations do I need?  Vaccines are usually given at various ages, according to a schedule. Your health care provider will recommend vaccines for you based on your age, medical history, and lifestyle or other factors, such as travel or where you work. What tests do I need? Screening Your health care provider may recommend screening tests for certain conditions. This may include: Lipid and cholesterol levels. Diabetes screening. This is done by checking your blood sugar (glucose) after you have not eaten for a while (fasting). Hepatitis C test. Hepatitis B test. HIV (human immunodeficiency virus) test. STI (sexually transmitted infection) testing, if you are at risk. Lung cancer screening. Colorectal cancer screening. Prostate cancer screening. Abdominal aortic aneurysm (AAA) screening. You may need this if you  are a current or former smoker. Talk with your health care provider about your test results, treatment options, and if necessary, the need for more tests. Follow these instructions at home: Eating and drinking  Eat a diet that includes fresh fruits and vegetables, whole grains, lean protein, and low-fat dairy products. Limit your intake of foods with  high amounts of sugar, saturated fats, and salt. Take vitamin and mineral supplements as recommended by your health care provider. Do not drink alcohol if your health care provider tells you not to drink. If you drink alcohol: Limit how much you have to 0-2 drinks a day. Know how much alcohol is in your drink. In the [place] one drink equals one 12 oz bottle of beer (355 mL), one 5 oz glass of wine (148 mL), or one 1 oz glass of hard liquor (44 mL). Lifestyle Brush your teeth every morning and night with fluoride toothpaste. Floss one time each day. Exercise for at least 30 minutes 5 or more days each week. Do not use any products that contain nicotine or tobacco. These products include cigarettes, chewing tobacco, and vaping devices, such as e-cigarettes. If you need help quitting, ask your health care provider. Do not use drugs. If you are sexually active, practice safe sex. Use a condom or other form of protection to prevent STIs. Take aspirin  only as told by your health care provider. Make sure that you understand how much to take and what form to take. Work with your health care provider to find out whether it is safe and beneficial for you to take aspirin  daily. Ask your health care provider if you need to take a cholesterol-lowering medicine (statin). Find healthy ways to manage stress, such as: Meditation, yoga, or listening to music. Journaling. Talking to a trusted person. Spending time with friends and family. Safety Always wear your seat belt while driving or riding in a vehicle. Do not drive: If you have been drinking alcohol. Do not ride with someone who has been drinking. When you are tired or distracted. While texting. If you have been using any mind-altering substances or drugs. Wear a helmet and other protective equipment during sports activities. If you have firearms in your house, make sure you follow all gun safety procedures. Minimize exposure to UV radiation to  reduce your risk of skin cancer. What's next? Visit your health care provider once a year for an annual wellness visit. Ask your health care provider how often you should have your eyes and teeth checked. Stay up to date on all vaccines. This information is not intended to replace advice given to you by your health care provider. Make sure you discuss any questions you have with your health care provider. Document Revised: 07/10/2020 Document Reviewed: 07/10/2020 Elsevier Patient Education  2024 [employer].

## 2023-02-08 NOTE — Assessment & Plan Note (Signed)
 Stable on Ambien as needed.  Does not need refill today.

## 2023-02-08 NOTE — Assessment & Plan Note (Signed)
 Following with pulmonology.  On stiletto and albuterol.  Overall symptoms are stable.

## 2023-02-08 NOTE — Assessment & Plan Note (Signed)
 Check A1c.  We discussed lifestyle modifications.

## 2023-02-08 NOTE — Assessment & Plan Note (Signed)
 Follows with cardiology for this.  Will check labs.  He is on Lipitor 80 mg daily and Zetia 10 mg daily.  We discussed lifestyle modifications.

## 2023-02-08 NOTE — Progress Notes (Signed)
 Chief Complaint:  Jacob Frost is a 68 y.o. male who presents today for his annual comprehensive physical exam.    Assessment/Plan:  Chronic Problems Addressed Today: Hyperlipidemia Follows with cardiology for this.  Will check labs.  He is on Lipitor 80 mg daily and Zetia  10 mg daily.  We discussed lifestyle modifications.  Essential hypertension Blood pressure at goal on lisinopril  10 mg daily.  Chronic obstructive pulmonary disease [facility]) Following with pulmonology.  On stiletto and albuterol .  Overall symptoms are stable.  Hyperglycemia Check A1c.  We discussed lifestyle modifications.  Insomnia Stable on Ambien  as needed.  Does not need refill today.  Preventative Healthcare: Check labs. UpToDate on vaccines.  Due for next colonoscopy in 2030.  Patient Counseling(The following topics were reviewed and/or handout was given):  -Nutrition: Stressed importance of moderation in sodium/caffeine intake, saturated fat and cholesterol, caloric balance, sufficient intake of fresh fruits, vegetables, and fiber.  -Stressed the importance of regular exercise.   -Substance Abuse: Discussed cessation/primary prevention of tobacco, alcohol, or other drug use; driving or other dangerous activities under the influence; availability of treatment for abuse.   -Injury prevention: Discussed safety belts, safety helmets, smoke detector, smoking near bedding or upholstery.   -Sexuality: Discussed sexually transmitted diseases, partner selection, use of condoms, avoidance of unintended pregnancy and contraceptive alternatives.   -Dental health: Discussed importance of regular tooth brushing, flossing, and dental visits.  -Health maintenance and immunizations reviewed. Please refer to Health maintenance section.  Return to care in 1 year for next preventative visit.     Subjective:  HPI:  He has no acute complaints today. See Assessment / plan for status of chronic conditions. He is doing well  today.   Lifestyle Diet: None specific.  Exercise: None specific.      02/08/2023    7:56 AM  Depression screen PHQ 2/9  Decreased Interest 0  Down, Depressed, Hopeless 0  PHQ - 2 Score 0    Health Maintenance Due  Topic Date Due   Medicare Annual Wellness (AWV)  03/11/2023     ROS: Per HPI, otherwise a complete review of systems was negative.   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   Hyperlipidemia 04/05/2009   Essential hypertension 02/08/2009   Chronic obstructive pulmonary disease (HCC) 02/08/2009   OSTEOPOROSIS 02/08/2009   History of colonic polyps 02/08/2009   Past Surgical History:  Procedure Laterality Date   CARDIAC CATHETERIZATION     TONSILLECTOMY     WISDOM TOOTH EXTRACTION      Family History  Problem Relation Age of Onset   Diabetes Father    Diabetes Sister    Hypertension Sister    Heart attack Paternal Grandfather    Colon cancer Neg Hx    Stomach cancer Neg Hx    Esophageal cancer Neg Hx    Pancreatic cancer Neg Hx     Medications- reviewed  and updated Current Outpatient Medications  Medication Sig Dispense Refill   albuterol  (PROAIR  HFA) 108 (90 Base) MCG/ACT inhaler Inhale 2 puffs into the lungs every 4 (four) hours as needed for wheezing or shortness of breath. 1 each 3   b complex vitamins capsule Take 1 capsule by mouth daily.     clobetasol  (OLUX ) 0.05 % topical  foam Apply topically 2 (two) times daily. 100 g 0   ezetimibe  (ZETIA ) 10 MG tablet Take 1 tablet (10 mg total) by mouth daily. 90 tablet 3   lisinopril  (ZESTRIL ) 5 MG tablet Take 1 tablet (5 mg total) by mouth daily. 90 tablet 1   Multiple Vitamin (MULTIVITAMIN WITH MINERALS) TABS tablet Take 1 tablet by mouth daily.     Omega-3 Fatty Acids (FISH OIL) 1000 MG CAPS Take by mouth.     sildenafil  (VIAGRA ) 100 MG tablet TAKE 1 TABLET BY MOUTH ONCE DAILY AS NEEDED 30 tablet 0   Tiotropium Bromide -Olodaterol (STIOLTO RESPIMAT ) 2.5-2.5 MCG/ACT AERS Inhale 2 puffs into the lungs daily. 2 Inhaler 0   VITAMIN D PO Take by mouth.     zolpidem  (AMBIEN ) 10 MG tablet Take 1 tablet (10 mg total) by mouth at bedtime as needed for sleep. 30 tablet 2   atorvastatin  (LIPITOR) 80 MG tablet Take 1 tablet (80 mg total) by mouth daily. 90 tablet 3   No current facility-administered medications for this visit.    Allergies-reviewed and updated No Known Allergies  Social History   Socioeconomic History   Marital status: Single    Spouse name: Not on file   Number of children: Not on file   Years of education: Not on file   Highest education level: 12th grade  Occupational History   Occupation: Retired Training And Development Officer  Tobacco Use   Smoking status: Former    Current packs/day: 0.00    Average packs/day: 2.0 packs/day for 37.0 years (74.0 ttl pk-yrs)    Types: Cigarettes    Start date: 07/16/1968    Quit date: 07/16/2005    Years since quitting: 17.5   Smokeless tobacco: Never  Substance and Sexual Activity   Alcohol use: Never   Drug use: Never   Sexual activity: Not on file  Other Topics Concern   Not on file  Social History Narrative   Not on file   Social Drivers of Health   Financial Resource Strain: Low Risk  (08/02/2022)   Overall Financial Resource Strain (CARDIA)    Difficulty of Paying Living Expenses: Not hard at all  Food Insecurity: No Food Insecurity (08/02/2022)   Hunger  Vital Sign    Worried About Running Out of Food in the Last Year: Never true    Ran Out of Food in the Last Year: Never true  Transportation Needs: No Transportation Needs (08/02/2022)   PRAPARE - Administrator, Civil Service (Medical): No    Lack of Transportation (Non-Medical): No  Physical Activity: Unknown (08/02/2022)   Exercise Vital Sign    Days of Exercise per Week: 0 days    Minutes of Exercise per Session: Not on file  Stress: No Stress Concern Present (08/02/2022)   [employer] of Occupational Health - Occupational Stress Questionnaire    Feeling of Stress : Not at all  Social Connections: Socially Isolated (08/02/2022)   Social Connection and Isolation Panel [NHANES]    Frequency of Communication with Friends and Family: Never    Frequency of Social Gatherings with Friends and Family:  Never    Attends Religious Services: Never    Active Member of Clubs or Organizations: No    Attends Banker Meetings: Never    Marital Status: Never married        Objective:  Physical Exam: BP 138/83   Pulse 70   Temp 97.7 F (36.5 C) (Temporal)   Ht 5' 9 (1.753 m)   Wt 225 lb 9.6 oz (102.3 kg)   SpO2 96%   BMI 33.32 kg/m   Body mass index is 33.32 kg/m. Wt Readings from Last 3 Encounters:  02/08/23 225 lb 9.6 oz (102.3 kg)  09/07/22 222 lb 9.6 oz (101 kg)  08/06/22 225 lb (102.1 kg)   Gen: NAD, resting comfortably HEENT: TMs normal bilaterally. OP clear. No thyromegaly noted.  CV: RRR with no murmurs appreciated Pulm: NWOB, CTAB with no crackles, wheezes, or rhonchi GI: Normal bowel sounds present. Soft, Nontender, Nondistended. MSK: no edema, cyanosis, or clubbing noted Skin: warm, dry Neuro: CN2-12 grossly intact. Strength 5/5 in upper and lower extremities. Reflexes symmetric and intact bilaterally.  Psych: Normal affect and thought content     Bartolo Montanye M. Kennyth, MD 02/08/2023 8:18 AM

## 2023-02-08 NOTE — Assessment & Plan Note (Signed)
Blood pressure at goal on lisinopril 10 mg daily.

## 2023-02-09 NOTE — Progress Notes (Signed)
 His cholesterol levels are at goal.  His A1c is mildly elevated but stable compared to previous.  Do not need to change treatment plan for this but he should continue to work on diet and exercise and we can recheck these in a year.  A couple of his liver numbers were elevated.  I would like for him to come back to recheck c-Met and GGT.  Please place future order.   The rest of the labs are all stable and we can recheck everything else in 6 months.

## 2023-02-10 ENCOUNTER — Other Ambulatory Visit: Payer: Self-pay | Admitting: *Deleted

## 2023-02-10 DIAGNOSIS — R748 Abnormal levels of other serum enzymes: Secondary | ICD-10-CM

## 2023-02-19 ENCOUNTER — Other Ambulatory Visit: Payer: Self-pay | Admitting: Family Medicine

## 2023-02-19 ENCOUNTER — Other Ambulatory Visit (INDEPENDENT_AMBULATORY_CARE_PROVIDER_SITE_OTHER): Payer: Medicare PPO

## 2023-02-19 ENCOUNTER — Encounter: Payer: Self-pay | Admitting: Family Medicine

## 2023-02-19 DIAGNOSIS — R748 Abnormal levels of other serum enzymes: Secondary | ICD-10-CM

## 2023-02-19 DIAGNOSIS — E782 Mixed hyperlipidemia: Secondary | ICD-10-CM

## 2023-02-19 LAB — GAMMA GT: GGT: 30 U/L (ref 7–51)

## 2023-02-19 LAB — COMPREHENSIVE METABOLIC PANEL
ALT: 55 U/L — ABNORMAL HIGH (ref 0–53)
AST: 31 U/L (ref 0–37)
Albumin: 4.5 g/dL (ref 3.5–5.2)
Alkaline Phosphatase: 152 U/L — ABNORMAL HIGH (ref 39–117)
BUN: 11 mg/dL (ref 6–23)
CO2: 29 meq/L (ref 19–32)
Calcium: 10.2 mg/dL (ref 8.4–10.5)
Chloride: 104 meq/L (ref 96–112)
Creatinine, Ser: 0.8 mg/dL (ref 0.40–1.50)
GFR: 91 mL/min (ref >60.00)
Glucose, Bld: 99 mg/dL (ref 70–99)
Potassium: 4.1 meq/L (ref 3.5–5.1)
Sodium: 140 meq/L (ref 135–145)
Total Bilirubin: 0.7 mg/dL (ref 0.2–1.2)
Total Protein: 7.2 g/dL (ref 6.0–8.3)

## 2023-02-19 NOTE — Progress Notes (Signed)
His liver numbers are still slightly elevated.  This is probably nothing however we should probably check an ultrasound of his gallbladder and liver to make sure there are no stones or anything else going on.  Please place order for right upper quadrant ultrasound.

## 2023-02-19 NOTE — Telephone Encounter (Signed)
Copied from CRM [id number]. Topic: Clinical - Medication Refill >> Feb 19, 2023  2:25 PM Denese Killings wrote: Most Recent Primary Care Visit:  Provider: [facility]  Department: [facility]  Visit Type: LAB  Date: 02/19/2023  Medication: albuterol (PROAIR HFA) 108 (90 Base) MCG/ACT inhaler  Has the patient contacted their pharmacy? Yes (Agent: If no, request that the patient contact the pharmacy for the refill. If patient does not wish to contact the pharmacy document the reason why and proceed with request.) (Agent: If yes, when and what did the pharmacy advise?) stated that they dont transfer prescriptions from another pharmacy   Is this the correct pharmacy for this prescription? Yes If no, delete pharmacy and type the correct one.  This is the patient's preferred pharmacy:   [facility] - [place], [place] - [address] [place] [address] Phone: [phone] Fax: [phone]   Has the prescription been filled recently? No  Is the patient out of the medication? No  Has the patient been seen for an appointment in the last year OR does the patient have an upcoming appointment? Yes  Can we respond through MyChart? Yes  Agent: Please be advised that Rx refills may take up to 3 business days. We ask that you follow-up with your pharmacy.

## 2023-02-22 ENCOUNTER — Other Ambulatory Visit: Payer: Self-pay | Admitting: *Deleted

## 2023-02-22 DIAGNOSIS — R748 Abnormal levels of other serum enzymes: Secondary | ICD-10-CM

## 2023-02-24 ENCOUNTER — Other Ambulatory Visit: Payer: Self-pay | Admitting: *Deleted

## 2023-02-24 ENCOUNTER — Other Ambulatory Visit: Payer: Self-pay | Admitting: Family Medicine

## 2023-02-24 DIAGNOSIS — E782 Mixed hyperlipidemia: Secondary | ICD-10-CM

## 2023-02-24 MED ORDER — ATORVASTATIN CALCIUM 80 MG PO TABS: 80.0000 mg | ORAL_TABLET | Freq: Every day | ORAL | 3 refills | Status: AC

## 2023-02-24 NOTE — Telephone Encounter (Signed)
Copied from CRM [id number]. Topic: Clinical - Medication Refill >> Feb 24, 2023  1:59 PM Adelina Mings wrote: Most Recent Primary Care Visit:  Provider: [facility]  Department: [facility]  Visit Type: LAB  Date: 02/19/2023  Medication: atorvastatin (LIPITOR) 80 MG tablet   Has the patient contacted their pharmacy? Yes (Agent: If no, request that the patient contact the pharmacy for the refill. If patient does not wish to contact the pharmacy document the reason why and proceed with request.) (Agent: If yes, when and what did the pharmacy advise?)  Is this the correct pharmacy for this prescription? Yes If no, delete pharmacy and type the correct one.  This is the patient's preferred pharmacy:  [facility] - [place], [place] - [address] [place] [address] Phone: [phone] Fax: [phone]   Has the prescription been filled recently? No  Is the patient out of the medication? Yes  Has the patient been seen for an appointment in the last year OR does the patient have an upcoming appointment? Yes  Can we respond through MyChart? No  Agent: Please be advised that Rx refills may take up to 3 business days. We ask that you follow-up with your pharmacy.

## 2023-02-25 ENCOUNTER — Ambulatory Visit
Admission: RE | Admit: 2023-02-25 | Discharge: 2023-02-25 | Disposition: A | Discharge status: Home / Self Care | Payer: Medicare PPO | Source: Ambulatory Visit | Attending: Family Medicine | Admitting: Family Medicine

## 2023-02-25 DIAGNOSIS — R7989 Other specified abnormal findings of blood chemistry: Secondary | ICD-10-CM | POA: Diagnosis not present

## 2023-02-25 DIAGNOSIS — R748 Abnormal levels of other serum enzymes: Secondary | ICD-10-CM

## 2023-02-26 ENCOUNTER — Encounter: Payer: Self-pay | Admitting: Family Medicine

## 2023-02-26 NOTE — Progress Notes (Signed)
His ultrasound shows that he has fatty liver.  No gallstones or other major findings.  Do not need to start any medications for this however he should continue to work on healthy diet and regular exercise.  We will continue to monitor his liver numbers through blood work yearly.

## 2023-02-28 NOTE — Progress Notes (Signed)
 Cardiology Office Note:  .   Date:  03/02/2023  ID:  Jacob Frost, DOB 11-26-1955, MRN [id number] PCP: Kennyth Worth HERO, MD  [facility] Providers Cardiologist:  Darryle ONEIDA Decent, MD { History of Present Illness: Jacob Frost is a 68 y.o. male with history of non-obstructive CAD, HLD, HTN, COPD who presents for follow-up.   History of Present Illness   Jacob Frost is a 68 year old male with nonobstructive coronary artery disease who presents for follow-up.  He has nonobstructive coronary artery disease and reports no chest pain or dyspnea since the last visit. He continues on aspirin  81 mg daily, Lipitor 80 mg, and Zetia  10 mg. His LDL cholesterol is well controlled at 69 mg/dL.  His blood pressure is well managed on lisinopril  5 mg daily, with a recent reading of 116/71 mmHg.  A lung nodule was recently identified and is being monitored with annual CT scans, with the next scan scheduled for April.  He has elevated liver enzymes and was diagnosed with fatty liver following a sonogram. He has started exercising daily, walking 1.3 miles each evening, with plans to increase to 2.6 miles.  He recalls a past diagnosis of sciatic nerve damage and a pulled muscle in his back, which required physical therapy. He reports significant improvement and is now nearly back to normal.          Problem List Non-obstructive CAD -[facility] 07/09/2020 -> Mild CAD HLD -T chol 119, HDL 28, LDL 69, TG 111 HTN COPD Obesity -BMI 33 6. Fatty liver     ROS: All other ROS reviewed and negative. Pertinent positives noted in the HPI.     Studies Reviewed: SABRA   EKG Interpretation Date/Time:  Tuesday March 02 2023 08:39:58 EST Ventricular Rate:  68 PR Interval:  144 QRS Duration:  96 QT Interval:  368 QTC Calculation: 391 R Axis:   24  Text Interpretation: Normal sinus rhythm Normal ECG Confirmed by Decent Darryle [phone]) on 03/02/2023 8:50:57 AM   [facility] 07/09/2020 DIAGNOSTIC FINDINGS      1.  Left dominant circulation.  2.  Mild, nonobstructive CAD   Physical Exam:   VS:  BP 116/71 (BP Location: Left Arm, Patient Position: Sitting, Cuff Size: Normal)   Pulse 77   Ht 5' 9 (1.753 m)   Wt 225 lb 12.8 oz (102.4 kg)   SpO2 94%   BMI 33.34 kg/m    Wt Readings from Last 3 Encounters:  03/02/23 225 lb 12.8 oz (102.4 kg)  02/08/23 225 lb 9.6 oz (102.3 kg)  09/07/22 222 lb 9.6 oz (101 kg)    GEN: Well nourished, well developed in no acute distress NECK: No JVD; L carotid bruit CARDIAC: RRR, no murmurs, rubs, gallops RESPIRATORY:  Clear to auscultation without rales, wheezing or rhonchi  ABDOMEN: Soft, non-tender, non-distended EXTREMITIES:  No edema; No deformity  ASSESSMENT AND PLAN: .   Assessment and Plan    Nonobstructive CAD No chest pain or trouble breathing. LDL 69 on Lipitor 80mg  and Zetia  10mg . Aspirin  81mg  daily continued. -Continue current medications. -Annual follow-up.  Fatty Liver Recent diagnosis. BMI 33. Discussed weight loss and exercise. -Encourage weight loss and exercise. -Continue cholesterol-lowering medications.  Hypertension Well controlled on Lisinopril  5mg  daily. BP 116/71. -Continue Lisinopril  5mg  daily.  Carotid Bruit Noted on left side during examination. No previous carotid ultrasounds. -Order carotid ultrasound.  General Health Maintenance -Continue daily exercise and weight loss efforts. -Plan for yearly  follow-up.              Follow-up: Return in about 1 year (around 03/01/2024).   Signed, Darryle DASEN. Barbaraann, MD, [facility]  [facility]  [address] [place], [place] [address] [phone]  8:54 AM

## 2023-03-02 ENCOUNTER — Ambulatory Visit: Payer: Medicare PPO | Attending: Cardiovascular Disease | Admitting: Cardiovascular Disease

## 2023-03-02 ENCOUNTER — Encounter: Payer: Self-pay | Admitting: Cardiovascular Disease

## 2023-03-02 VITALS — BP 116/71 | HR 77 | Ht 69.0 in | Wt 225.8 lb

## 2023-03-02 DIAGNOSIS — Z136 Encounter for screening for cardiovascular disorders: Secondary | ICD-10-CM

## 2023-03-02 DIAGNOSIS — I2510 Atherosclerotic heart disease of native coronary artery without angina pectoris: Secondary | ICD-10-CM

## 2023-03-02 DIAGNOSIS — R0989 Other specified symptoms and signs involving the circulatory and respiratory systems: Secondary | ICD-10-CM | POA: Diagnosis not present

## 2023-03-02 DIAGNOSIS — E782 Mixed hyperlipidemia: Secondary | ICD-10-CM | POA: Diagnosis not present

## 2023-03-02 DIAGNOSIS — I150 Renovascular hypertension: Secondary | ICD-10-CM

## 2023-03-02 DIAGNOSIS — E669 Obesity, unspecified: Secondary | ICD-10-CM | POA: Diagnosis not present

## 2023-03-02 NOTE — Patient Instructions (Signed)
Medication Instructions:  Your physician recommends that you continue on your current medications as directed. Please refer to the Current Medication list given to you today.    *If you need a refill on your cardiac medications before your next appointment, please call your pharmacy*   Lab Work: None    If you have labs (blood work) drawn today and your tests are completely normal, you will receive your results only by: MyChart Message (if you have MyChart) OR A paper copy in the mail If you have any lab test that is abnormal or we need to change your treatment, we will call you to review the results.   Testing/Procedures: Your physician has requested that you have a carotid duplex. This test is an ultrasound of the carotid arteries in your neck. It looks at blood flow through these arteries that supply the brain with blood. Allow one hour for this exam. There are no restrictions or special instructions.    Follow-Up: At [facility], you and your health needs are our priority.  As part of our continuing mission to provide you with exceptional heart care, we have created designated Provider Care Teams.  These Care Teams include your primary Cardiologist (physician) and Advanced Practice Providers (APPs -  Physician Assistants and Nurse Practitioners) who all work together to provide you with the care you need, when you need it.  We recommend signing up for the patient portal called "MyChart".  Sign up information is provided on this After Visit Summary.  MyChart is used to connect with patients for Virtual Visits (Telemedicine).  Patients are able to view lab/test results, encounter notes, upcoming appointments, etc.  Non-urgent messages can be sent to your provider as well.   To learn more about what you can do with MyChart, go to [email or web address].    Your next appointment:   1 year(s)  The format for your next appointment:   In Person  Provider:   Edd Fabian, FNP,  Micah Flesher, PA-C, Marjie Skiff, PA-C, Robet Leu, PA-C, Juanda Crumble, PA-C, Joni Reining, DNP, ANP, Azalee Course, PA-C, Bernadene Person, NP, or Reather Littler, NP      Other Instructions

## 2023-03-03 ENCOUNTER — Ambulatory Visit (HOSPITAL_COMMUNITY): Payer: Medicare PPO

## 2023-03-18 ENCOUNTER — Ambulatory Visit: Payer: Medicare PPO

## 2023-03-18 VITALS — BP 110/62 | HR 74 | Temp 98.9°F | Wt 225.0 lb

## 2023-03-18 DIAGNOSIS — Z0000 Encounter for general adult medical examination without abnormal findings: Secondary | ICD-10-CM

## 2023-03-18 NOTE — Progress Notes (Signed)
 Subjective:   Jacob Frost is a 68 y.o. who presents for a Medicare Wellness preventive visit.  Visit Complete: In person    AWV Questionnaire: Yes: Patient Medicare AWV questionnaire was completed by the patient on 03/15/23; I have confirmed that all information answered by patient is correct and no changes since this date.  Cardiac Risk Factors include: advanced age (>78men, >1 women);dyslipidemia;hypertension;male gender;obesity (BMI >30kg/m2)     Objective:    Today's Vitals   03/18/23 1011  BP: 110/62  Pulse: 74  Temp: 98.9 F (37.2 C)  SpO2: 96%  Weight: 225 lb (102.1 kg)   Body mass index is 33.23 kg/m.     03/18/2023   10:19 AM 09/22/2022   11:16 AM 03/10/2022   10:41 AM 09/06/2020    2:22 PM  Advanced Directives  Does Patient Have a Medical Advance Directive? No No Yes No  Type of Best boy of [place];Living will   Copy of Healthcare Power of Attorney in Chart?   No - copy requested   Would patient like information on creating a medical advance directive? No - Patient declined No - Patient declined  No - Patient declined    Current Medications (verified) Outpatient Encounter Medications as of 03/18/2023  Medication Sig   albuterol (PROAIR HFA) 108 (90 Base) MCG/ACT inhaler Inhale 2 puffs into the lungs every 4 (four) hours as needed for wheezing or shortness of breath.   atorvastatin (LIPITOR) 80 MG tablet Take 1 tablet (80 mg total) by mouth daily.   b complex vitamins capsule Take 1 capsule by mouth daily.   clobetasol (OLUX) 0.05 % topical foam Apply topically 2 (two) times daily.   ezetimibe (ZETIA) 10 MG tablet Take 1 tablet (10 mg total) by mouth daily.   FLUAD 0.5 ML injection    lisinopril (ZESTRIL) 5 MG tablet Take 1 tablet (5 mg total) by mouth daily.   Multiple Vitamin (MULTIVITAMIN WITH MINERALS) TABS tablet Take 1 tablet by mouth daily.   Omega-3 Fatty Acids (FISH OIL) 1000 MG CAPS Take by mouth.   sildenafil  (VIAGRA) 100 MG tablet TAKE 1 TABLET BY MOUTH ONCE DAILY AS NEEDED   Tiotropium Bromide-Olodaterol (STIOLTO RESPIMAT) 2.5-2.5 MCG/ACT AERS Inhale 2 puffs into the lungs daily.   VITAMIN D PO Take by mouth.   zolpidem (AMBIEN) 10 MG tablet Take 1 tablet (10 mg total) by mouth at bedtime as needed for sleep.   No facility-administered encounter medications on file as of 03/18/2023.    Allergies (verified) Patient has no known allergies.   History: Past Medical History:  Diagnosis Date   Aortic atherosclerosis (HCC)    COPD (chronic obstructive pulmonary disease) (HCC)    Emphysema of lung (HCC)    Hyperlipidemia    Hypertension    Past Surgical History:  Procedure Laterality Date   CARDIAC CATHETERIZATION     TONSILLECTOMY     WISDOM TOOTH EXTRACTION     Family History  Problem Relation Age of Onset   Diabetes Father    Diabetes Sister    Hypertension Sister    Heart attack Paternal Grandfather    Colon cancer Neg Hx    Stomach cancer Neg Hx    Esophageal cancer Neg Hx    Pancreatic cancer Neg Hx    Social History   Socioeconomic History   Marital status: Single    Spouse name: Not on file   Number of children: Not on file   Years  of education: Not on file   Highest education level: 12th grade  Occupational History   Occupation: Retired Training and development officer  Tobacco Use   Smoking status: Former    Current packs/day: 0.00    Average packs/day: 2.0 packs/day for 37.0 years (74.0 ttl pk-yrs)    Types: Cigarettes    Start date: 07/16/1968    Quit date: 07/16/2005    Years since quitting: 17.6   Smokeless tobacco: Never  Substance and Sexual Activity   Alcohol use: Never   Drug use: Never   Sexual activity: Not on file  Other Topics Concern   Not on file  Social History Narrative   Not on file   Social Drivers of Health   Financial Resource Strain: Low Risk  (03/18/2023)   Overall Financial Resource Strain (CARDIA)    Difficulty of Paying Living Expenses:  Not hard at all  Food Insecurity: No Food Insecurity (03/18/2023)   Hunger Vital Sign    Worried About Running Out of Food in the Last Year: Never true    Ran Out of Food in the Last Year: Never true  Transportation Needs: No Transportation Needs (03/18/2023)   PRAPARE - Administrator, Civil Service (Medical): No    Lack of Transportation (Non-Medical): No  Physical Activity: Sufficiently Active (03/18/2023)   Exercise Vital Sign    Days of Exercise per Week: 7 days    Minutes of Exercise per Session: 30 min  Stress: No Stress Concern Present (03/18/2023)   [employer] of Occupational Health - Occupational Stress Questionnaire    Feeling of Stress : Not at all  Social Connections: Socially Isolated (03/18/2023)   Social Connection and Isolation Panel [NHANES]    Frequency of Communication with Friends and Family: Never    Frequency of Social Gatherings with Friends and Family: More than three times a week    Attends Religious Services: Never    Database administrator or Organizations: No    Attends Engineer, structural: Never    Marital Status: Never married    Tobacco Counseling Counseling given: Not Answered    Clinical Intake:  Pre-visit preparation completed: Yes  Pain : No/denies pain     BMI - recorded: 33.23 Nutritional Status: BMI > 30  Obese Nutritional Risks: None Diabetes: No  How often do you need to have someone help you when you read instructions, pamphlets, or other written materials from your doctor or pharmacy?: 1 - Never  Interpreter Needed?: No  Information entered by :: Lanier Ensign, LPN   Activities of Daily Living     03/15/2023   12:10 PM  In your present state of health, do you have any difficulty performing the following activities:  Hearing? 0  Vision? 0  Difficulty concentrating or making decisions? 0  Walking or climbing stairs? 0  Dressing or bathing? 0  Doing errands, shopping? 0  Preparing Food and  eating ? N  Using the Toilet? N  In the past six months, have you accidently leaked urine? N  Do you have problems with loss of bowel control? N  Managing your Medications? N  Managing your Finances? N  Housekeeping or managing your Housekeeping? N    Patient Care Team: Ardith Dark, MD as PCP - General (Family Medicine) O'Neal, Ronnald Ramp, MD as PCP - Cardiology (Cardiology)  Indicate any recent Medical Services you may have received from other than [facility] providers in the past year (date may be  approximate).     Assessment:   This is a routine wellness examination for Wend[place].  Hearing/Vision screen Hearing Screening - Comments:: Pt denies any hearing issues  Vision Screening - Comments:: Pt encouraged to follow up with eye provider    Goals Addressed             This Visit's Progress    Patient Stated       Maintain health and activity        Depression Screen     03/18/2023   10:20 AM 02/08/2023    7:56 AM 08/06/2022    8:16 AM 03/10/2022   10:42 AM 02/20/2022   11:05 AM 02/20/2022   11:01 AM 02/05/2022    7:50 AM  PHQ 2/9 Scores  PHQ - 2 Score 0 0 0 0 0 0 0  PHQ- 9 Score   0        Fall Risk     03/15/2023   12:10 PM 02/08/2023    7:56 AM 08/06/2022    8:16 AM 03/10/2022    9:31 AM 02/20/2022   11:05 AM  Fall Risk   Falls in the past year? 0 0 0 0 0  Number falls in past yr:  0 0 0 0  Injury with Fall?  0 0 0 0  Risk for fall due to : No Fall Risks No Fall Risks No Fall Risks  No Fall Risks  Follow up Falls prevention discussed  Falls evaluation completed Falls prevention discussed     MEDICARE RISK AT HOME:  Medicare Risk at Home Any stairs in or around the home?: (Patient-Rptd) No Home free of loose throw rugs in walkways, pet beds, electrical cords, etc?: (Patient-Rptd) Yes Adequate lighting in your home to reduce risk of falls?: (Patient-Rptd) Yes Life alert?: (Patient-Rptd) No Use of a cane, walker or w/c?: (Patient-Rptd) No Grab bars in the  bathroom?: (Patient-Rptd) No Shower chair or bench in shower?: (Patient-Rptd) No Elevated toilet seat or a handicapped toilet?: (Patient-Rptd) No  TIMED UP AND GO:  Was the test performed?  Yes  Length of time to ambulate 10 feet: 10 sec Gait steady and fast without use of assistive device  Cognitive Function: 6CIT completed        03/18/2023   10:22 AM 03/10/2022   10:45 AM 09/06/2020    2:25 PM  6CIT Screen  What Year? 0 points 0 points 0 points  What month? 0 points 0 points 0 points  What time? 0 points 0 points 0 points  Count back from 20 0 points 0 points 0 points  Months in reverse 0 points 0 points 2 points  Repeat phrase 0 points 0 points 2 points  Total Score 0 points 0 points 4 points    Immunizations Immunization History  Administered Date(s) Administered   Fluad Quad(high Dose 65+) 10/03/2020, 10/16/2021   Influenza Whole 12/11/2008, 12/10/2009   Influenza, High Dose Seasonal PF 11/18/2019   Influenza, Quadrivalent, Recombinant, Inj, Pf 11/26/2016   Influenza,inj,Quad PF,6+ Mos 11/04/2015, 10/20/2017, 10/24/2018   Influenza-Unspecified 11/26/2016, 10/20/2017, 11/19/2019, 10/08/2022   PFIZER(Purple Top)SARS-COV-2 Vaccination 04/01/2019, 04/22/2019   PNEUMOCOCCAL CONJUGATE-20 03/18/2022   Pneumococcal Conjugate-13 12/02/2017   Pneumococcal Polysaccharide-23 02/08/2009, 02/22/2020   Td 07/04/2008   Tdap 07/04/2008, 04/25/2022   Zoster Recombinant(Shingrix) 02/07/2022, 04/09/2022    Screening Tests Health Maintenance  Topic Date Due   Medicare Annual Wellness (AWV)  03/17/2024   Colonoscopy  05/28/2028   DTaP/Tdap/Td (4 - Td or Tdap) 04/24/2032  Pneumonia Vaccine 105+ Years old  Completed   INFLUENZA VACCINE  Completed   Hepatitis C Screening  Completed   Zoster Vaccines- Shingrix  Completed   HPV VACCINES  Aged Out   COVID-19 Vaccine  Discontinued    Health Maintenance  There are no preventive care reminders to display for this patient.  Health  Maintenance Items Addressed:    Additional Screening:  Vision Screening: Recommended annual ophthalmology exams for early detection of glaucoma and other disorders of the eye.  Dental Screening: Recommended annual dental exams for proper oral hygiene  Community Resource Referral / Chronic Care Management: CRR required this visit?  No   CCM required this visit?  No     Plan:     I have personally reviewed and noted the following in the patient's chart:   Medical and social history Use of alcohol, tobacco or illicit drugs  Current medications and supplements including opioid prescriptions. Patient is not currently taking opioid prescriptions. Functional ability and status Nutritional status Physical activity Advanced directives List of other physicians Hospitalizations, surgeries, and ER visits in previous 12 months Vitals Screenings to include cognitive, depression, and falls Referrals and appointments  In addition, I have reviewed and discussed with patient certain preventive protocols, quality metrics, and best practice recommendations. A written personalized care plan for preventive services as well as general preventive health recommendations were provided to patient.     Marzella Schlein, LPN   1/61/0960   After Visit Summary: (MyChart) Due to this being a telephonic visit, the after visit summary with patients personalized plan was offered to patient via MyChart   Notes: Nothing significant to report at this time.

## 2023-03-18 NOTE — Patient Instructions (Signed)
 Jacob Frost , Thank you for taking time to come for your Medicare Wellness Visit. I appreciate your ongoing commitment to your health goals. Please review the following plan we discussed and let me know if I can assist you in the future.   Referrals/Orders/Follow-Ups/Clinician Recommendations: Aim for 30 minutes of exercise or brisk walking, 6-8 glasses of water, and 5 servings of fruits and vegetables each day. Maintain health and activity   This is a list of the screening recommended for you and due dates:  Health Maintenance  Topic Date Due   Medicare Annual Wellness Visit  03/17/2024   Colon Cancer Screening  05/28/2028   DTaP/Tdap/Td vaccine (4 - Td or Tdap) 04/24/2032   Pneumonia Vaccine  Completed   Flu Shot  Completed   Hepatitis C Screening  Completed   Zoster (Shingles) Vaccine  Completed   HPV Vaccine  Aged Out   COVID-19 Vaccine  Discontinued    Advanced directives: (Declined) Advance directive discussed with you today. Even though you declined this today, please call our office should you change your mind, and we can give you the proper paperwork for you to fill out.  Next Medicare Annual Wellness Visit scheduled for next year: Yes

## 2023-03-19 ENCOUNTER — Telehealth: Payer: Self-pay | Admitting: Cardiovascular Disease

## 2023-03-19 NOTE — Telephone Encounter (Signed)
 Called GI at [phone] to verify order for Carotid US is active.  Rep at GI states the order is there and active.  Pt is able to call them , Dial option #1 , then option #2 to get in touch with the rep who will schedule the test for them there.   Called and spoke to pt, Jacob Frost.  The above information was given to him.  He states he will call GI to have the test scheduled.

## 2023-03-19 NOTE — Telephone Encounter (Signed)
 Pt would like a c/b regarding CAROTID order. If possible pt would like to have test done at [facility] instead if our office.  Please advise

## 2023-03-23 ENCOUNTER — Ambulatory Visit
Admission: RE | Admit: 2023-03-23 | Discharge: 2023-03-23 | Disposition: A | Discharge status: Home / Self Care | Payer: Medicare PPO | Source: Ambulatory Visit | Attending: Cardiovascular Disease

## 2023-03-23 DIAGNOSIS — R0989 Other specified symptoms and signs involving the circulatory and respiratory systems: Secondary | ICD-10-CM

## 2023-03-24 ENCOUNTER — Encounter: Payer: Self-pay | Admitting: Cardiovascular Disease

## 2023-04-01 ENCOUNTER — Encounter: Payer: Self-pay | Admitting: Emergency Medicine

## 2023-04-30 ENCOUNTER — Ambulatory Visit (HOSPITAL_BASED_OUTPATIENT_CLINIC_OR_DEPARTMENT_OTHER)
Admission: RE | Admit: 2023-04-30 | Discharge: 2023-04-30 | Disposition: A | Discharge status: Home / Self Care | Source: Ambulatory Visit | Attending: Pulmonary Disease | Admitting: Pulmonary Disease

## 2023-04-30 DIAGNOSIS — R918 Other nonspecific abnormal finding of lung field: Secondary | ICD-10-CM | POA: Diagnosis not present

## 2023-04-30 DIAGNOSIS — R911 Solitary pulmonary nodule: Secondary | ICD-10-CM | POA: Diagnosis not present

## 2023-04-30 DIAGNOSIS — J432 Centrilobular emphysema: Secondary | ICD-10-CM | POA: Diagnosis not present

## 2023-04-30 DIAGNOSIS — D3501 Benign neoplasm of right adrenal gland: Secondary | ICD-10-CM | POA: Diagnosis not present

## 2023-05-21 ENCOUNTER — Other Ambulatory Visit: Payer: Self-pay | Admitting: *Deleted

## 2023-05-21 MED ORDER — LISINOPRIL 5 MG PO TABS: 5.0000 mg | ORAL_TABLET | Freq: Every day | ORAL | 1 refills | Status: DC

## 2023-06-21 ENCOUNTER — Other Ambulatory Visit: Payer: Self-pay | Admitting: Family Medicine

## 2023-06-26 ENCOUNTER — Other Ambulatory Visit: Payer: Self-pay | Admitting: Cardiovascular Disease

## 2023-06-28 DIAGNOSIS — J432 Centrilobular emphysema: Secondary | ICD-10-CM | POA: Diagnosis not present

## 2023-06-28 DIAGNOSIS — R911 Solitary pulmonary nodule: Secondary | ICD-10-CM | POA: Diagnosis not present

## 2023-07-06 ENCOUNTER — Telehealth: Payer: Self-pay | Admitting: Family Medicine

## 2023-07-06 NOTE — Telephone Encounter (Signed)
 ezetimibe  (ZETIA ) 10 MG tablet and zolpidem  (AMBIEN ) 10 MG tablet, to soon to refill

## 2023-07-06 NOTE — Telephone Encounter (Unsigned)
 Copied from CRM [id number]. Topic: Clinical - Medication Refill >> Jul 06, 2023  1:19 PM Alyse July wrote: Medication: zolpidem  (AMBIEN ) 10 MG tablet ezetimibe  (ZETIA ) 10 MG tablet  Has the patient contacted their pharmacy? Yes  [facility] - [place], [place] - [address] [place] [address] Phone: [phone] Fax: [phone]  Is this the correct pharmacy for this prescription? Yes If no, delete pharmacy and type the correct one.   Has the prescription been filled recently? No  Is the patient out of the medication? No  Has the patient been seen for an appointment in the last year OR does the patient have an upcoming appointment? Yes  Can we respond through MyChart? Yes  Agent: Please be advised that Rx refills may take up to 3 business days. We ask that you follow-up with your pharmacy.

## 2023-07-09 ENCOUNTER — Ambulatory Visit: Payer: Self-pay

## 2023-07-09 NOTE — Telephone Encounter (Signed)
Appt Monday

## 2023-07-09 NOTE — Telephone Encounter (Signed)
 FYI Only or Action Required?: FYI only for provider  Patient was last seen in primary care on 02/08/2023 by Rodney Clamp, MD. Called Nurse Triage reporting Cough. Symptoms began several days ago. Interventions attempted: OTC medications: mucinex. Symptoms are: Cough with green, discolored phlegm, Runny nose, Weakness, Chest hurts from coughingunchanged.  Triage Disposition: See Physician Within 24 Hours  Patient/caregiver understands and will follow disposition?: No, refuses disposition, No appts available today. Pt refuses UC. Pt has scheduled appt for Monday , but will seek care sooner if needed.            Copied from CRM [id number]. Topic: Clinical - Red Word Triage >> Jul 09, 2023  8:04 AM Alethia Huxley E wrote: [facility] that prompted transfer to Nurse Triage: Discolored phlegm / Weakness. Patient experiencing body weakness, and a cough producing yellow phlegm. Patient stated he also feels light-headed. Symptoms going on for 2-3 days. Reason for Disposition  [1] Known COPD or other severe lung disease (i.e., bronchiectasis, cystic fibrosis, lung surgery) AND [2] worsening symptoms (i.e., increased sputum purulence or amount, increased breathing difficulty  Answer Assessment - Initial Assessment Questions 1. ONSET: When did the cough begin?      2-3 days 2. SEVERITY: How bad is the cough today?      Moderate 3. SPUTUM: Describe the color of your sputum (none, dry cough; clear, white, yellow, green)     Yellow during the night a lot - during the day 1x per hour 4. HEMOPTYSIS: Are you coughing up any blood? If so ask: How much? (flecks, streaks, tablespoons, etc.)     no 5. DIFFICULTY BREATHING: Are you having difficulty breathing? If Yes, ask: How bad is it? (e.g., mild, moderate, severe)    - MILD: No SOB at rest, mild SOB with walking, speaks normally in sentences, can lie down, no retractions, pulse < 100.    - MODERATE: SOB at rest, SOB with minimal exertion and  prefers to sit, cannot lie down flat, speaks in phrases, mild retractions, audible wheezing, pulse 100-120.    - SEVERE: Very SOB at rest, speaks in single words, struggling to breathe, sitting hunched forward, retractions, pulse > 120      Moderate - no more 6. FEVER: Do you have a fever? If Yes, ask: What is your temperature, how was it measured, and when did it start?     no 7. CARDIAC HISTORY: Do you have any history of heart disease? (e.g., heart attack, congestive heart failure)      no 8. LUNG HISTORY: Do you have any history of lung disease?  (e.g., pulmonary embolus, asthma, emphysema)     COPD 9. PE RISK FACTORS: Do you have a history of blood clots? (or: recent major surgery, recent prolonged travel, bedridden)     no 10. OTHER SYMPTOMS: Do you have any other symptoms? (e.g., runny nose, wheezing, chest pain)       Weakness - runny nose, chest pain with cough  Protocols used: Cough - Acute Productive-A-AH

## 2023-07-12 ENCOUNTER — Ambulatory Visit (INDEPENDENT_AMBULATORY_CARE_PROVIDER_SITE_OTHER): Admitting: Family

## 2023-07-12 ENCOUNTER — Encounter: Payer: Self-pay | Admitting: Family

## 2023-07-12 VITALS — BP 118/78 | HR 84 | Temp 99.1°F | Ht 69.0 in | Wt 216.6 lb

## 2023-07-12 DIAGNOSIS — J441 Chronic obstructive pulmonary disease with (acute) exacerbation: Secondary | ICD-10-CM

## 2023-07-12 DIAGNOSIS — J069 Acute upper respiratory infection, unspecified: Secondary | ICD-10-CM

## 2023-07-12 MED ORDER — AMOXICILLIN-POT CLAVULANATE 875-125 MG PO TABS: 1.0000 | ORAL_TABLET | Freq: Two times a day (BID) | ORAL | 0 refills | Status: DC

## 2023-07-12 MED ORDER — PREDNISONE 10 MG PO TABS: ORAL_TABLET | ORAL | 0 refills | Status: DC

## 2023-07-12 NOTE — Progress Notes (Signed)
 Patient ID: Jacob Frost, male    DOB: 06-21-1955, 68 y.o.   MRN: [id number]  Chief Complaint  Patient presents with   Cough    States sever coug and spitting up two gallons of phlem. States his left eye started watering and it was sticky on Friday.  Symptoms runny nose, stuffed up nose and severe headaches. States he thinks it started last week.    History of Present Illness Jacob Frost is a 68 year old male with COPD and emphysema who presents with cough and phlegm production.  Symptoms have persisted for about a week, with significant coughing and thick, creamy phlegm that varies from white to yellow. He has COPD and emphysema but does not use his inhalers regularly. He has a rescue inhaler, a new steroid inhaler from his pulmonologist, and a recently refilled Stiolto inhaler, but has not started the new inhaler yet. Headaches and eye symptoms include watering and stickiness, initially in the left eye, then affecting the right eye, causing difficulty seeing and eyelids sticking shut. He has a sore throat, likely from coughing, and feels congested. No ear pain is present.  Assessment & Plan URI - Suspected acute bronchitis & conjunctivitis with possible bacterial component due to thick, congested sputum, lungs congested with rhonchi- exacerbated by COPD and emphysema and non-compliant use of inhalers. - Prescribed prednisone  to reduce inflammation, advised morning intake to avoid insomnia. - Prescribed Augmentin twice daily for one week, instructed to eat first and wait 15-20 minutes before taking. - Encouraged increased water intake to thin mucus. - Advised to restart use of prescribed inhalers, including Stiolto and new steroid inhaler from pulmonologist to manage COPD and prevent recurrence.  Chronic Obstructive Pulmonary Disease (COPD) exacerbation - COPD and emphysema present, not using prescribed inhalers. Emphasized importance of inhalers for management and prevention of  exacerbations. - Advised use of prescribed inhalers, including Stiolto and new steroid inhaler, to manage COPD and prevent recurrence. - F/U with Pulmonology prn    Subjective:    Outpatient Medications Prior to Visit  Medication Sig Dispense Refill   albuterol  (PROAIR  HFA) 108 (90 Base) MCG/ACT inhaler Inhale 2 puffs into the lungs every 4 (four) hours as needed for wheezing or shortness of breath. 1 each 3   atorvastatin  (LIPITOR) 80 MG tablet Take 1 tablet (80 mg total) by mouth daily. 90 tablet 3   b complex vitamins capsule Take 1 capsule by mouth daily.     clobetasol  (OLUX ) 0.05 % topical foam Apply topically 2 (two) times daily. 100 g 0   ezetimibe  (ZETIA ) 10 MG tablet Take 1 tablet by mouth once daily 90 tablet 0   FLUAD 0.5 ML injection      lisinopril  (ZESTRIL ) 5 MG tablet Take 1 tablet (5 mg total) by mouth daily. 90 tablet 1   Multiple Vitamin (MULTIVITAMIN WITH MINERALS) TABS tablet Take 1 tablet by mouth daily.     Omega-3 Fatty Acids (FISH OIL) 1000 MG CAPS Take by mouth.     Tiotropium Bromide -Olodaterol (STIOLTO RESPIMAT ) 2.5-2.5 MCG/ACT AERS Inhale 2 puffs into the lungs daily. 2 Inhaler 0   VITAMIN D PO Take by mouth.     zolpidem  (AMBIEN ) 10 MG tablet TAKE 1 TABLET BY MOUTH AT BEDTIME AS NEEDED FOR SLEEP 30 tablet 0   sildenafil  (VIAGRA ) 100 MG tablet TAKE 1 TABLET BY MOUTH ONCE DAILY AS NEEDED (Patient not taking: Reported on 07/12/2023) 30 tablet 0   No facility-administered medications prior to visit.  Past Medical History:  Diagnosis Date   Aortic atherosclerosis (HCC)    COPD (chronic obstructive pulmonary disease) (HCC)    Emphysema of lung (HCC)    Hyperlipidemia    Hypertension    Past Surgical History:  Procedure Laterality Date   CARDIAC CATHETERIZATION     TONSILLECTOMY     WISDOM TOOTH EXTRACTION     No Known Allergies    Objective:    Physical Exam Vitals and nursing note reviewed.  Constitutional:      General: He is not in acute  distress.    Appearance: Normal appearance.  HENT:     Head: Normocephalic.   Eyes:     Conjunctiva/sclera:     Right eye: Right conjunctiva is injected.     Left eye: Left conjunctiva is injected.    Cardiovascular:     Rate and Rhythm: Normal rate and regular rhythm.  Pulmonary:     Effort: Pulmonary effort is normal.     Breath sounds: Examination of the right-upper field reveals rhonchi. Examination of the left-upper field reveals rhonchi. Examination of the right-middle field reveals rhonchi. Examination of the left-middle field reveals rhonchi. Examination of the right-lower field reveals rhonchi. Rhonchi present.   Musculoskeletal:        General: Normal range of motion.     Cervical back: Normal range of motion.   Skin:    General: Skin is warm and dry.   Neurological:     Mental Status: He is alert and oriented to person, place, and time.   Psychiatric:        Mood and Affect: Mood normal.    BP 118/78   Pulse 84   Temp 99.1 F (37.3 C) (Temporal)   Ht 5' 9 (1.753 m)   Wt 216 lb 9.6 oz (98.2 kg)   SpO2 94%   BMI 31.99 kg/m  Wt Readings from Last 3 Encounters:  07/12/23 216 lb 9.6 oz (98.2 kg)  03/18/23 225 lb (102.1 kg)  03/02/23 225 lb 12.8 oz (102.4 kg)       Versa Gore, NP

## 2023-07-15 ENCOUNTER — Encounter: Payer: Self-pay | Admitting: Pharmacist

## 2023-07-15 NOTE — Progress Notes (Signed)
 Pharmacy Quality Measure Review  This patient is appearing on a report for being at risk of failing the adherence measure for cholesterol (statin) medications this calendar year.   Medication: atorvastatin  80mg  Last fill date: 02/25/2023 for 90 day supply (other fills for 80mg  dose were 90 day supply on 09/01/22 and 11/26/2022.   Spoke with patient has he reports he also had atorvastatin  40mg  #90 filled on 10/16/2022. He states he has had plenty of atorvastatin  because [facility] has continued to fill atorvastatin  even though he has had some on hand.  Mr. Bouffard endorses that he has been taking atorvastatin  80mg  daily.   Cecilie Coffee, PharmD Clinical Pharmacist [facility]  Population Health [phone]

## 2023-07-26 ENCOUNTER — Other Ambulatory Visit: Payer: Self-pay | Admitting: Family Medicine

## 2023-07-26 NOTE — Telephone Encounter (Signed)
 Last refill by  Barbaraann Darryle Ned, MD

## 2023-07-26 NOTE — Telephone Encounter (Unsigned)
 Copied from CRM [id number]. Topic: Clinical - Medication Refill >> Jul 26, 2023  9:43 AM Larissa S wrote: Medication: ezetimibe  (ZETIA ) 10 MG tablet  Has the patient contacted their pharmacy? Yes (Agent: If no, request that the patient contact the pharmacy for the refill. If patient does not wish to contact the pharmacy document the reason why and proceed with request.) (Agent: If yes, when and what did the pharmacy advise?)  This is the patient's preferred pharmacy:    [facility] - [place], [place] - [address] [place] [address] Phone: [phone] Fax: [phone]  Is this the correct pharmacy for this prescription? Yes If no, delete pharmacy and type the correct one.   Has the prescription been filled recently? Yes  Is the patient out of the medication? No  Has the patient been seen for an appointment in the last year OR does the patient have an upcoming appointment? Yes  Can we respond through MyChart? Yes  Agent: Please be advised that Rx refills may take up to 3 business days. We ask that you follow-up with your pharmacy.

## 2023-07-27 MED ORDER — EZETIMIBE 10 MG PO TABS: 10.0000 mg | ORAL_TABLET | Freq: Every day | ORAL | 0 refills | Status: DC

## 2023-08-10 ENCOUNTER — Encounter: Payer: Self-pay | Admitting: Family Medicine

## 2023-08-10 ENCOUNTER — Ambulatory Visit (INDEPENDENT_AMBULATORY_CARE_PROVIDER_SITE_OTHER): Payer: Medicare PPO | Admitting: Family Medicine

## 2023-08-10 VITALS — BP 103/66 | HR 65 | Temp 96.4°F | Ht 69.0 in | Wt 218.4 lb

## 2023-08-10 DIAGNOSIS — J449 Chronic obstructive pulmonary disease, unspecified: Secondary | ICD-10-CM

## 2023-08-10 DIAGNOSIS — I10 Essential (primary) hypertension: Secondary | ICD-10-CM | POA: Diagnosis not present

## 2023-08-10 DIAGNOSIS — E785 Hyperlipidemia, unspecified: Secondary | ICD-10-CM

## 2023-08-10 DIAGNOSIS — R739 Hyperglycemia, unspecified: Secondary | ICD-10-CM | POA: Diagnosis not present

## 2023-08-10 DIAGNOSIS — I700 Atherosclerosis of aorta: Secondary | ICD-10-CM | POA: Diagnosis not present

## 2023-08-10 LAB — COMPREHENSIVE METABOLIC PANEL WITH GFR
ALT: 36 U/L (ref 0–53)
AST: 23 U/L (ref 0–37)
Albumin: 4.4 g/dL (ref 3.5–5.2)
Alkaline Phosphatase: 128 U/L — ABNORMAL HIGH (ref 39–117)
BUN: 15 mg/dL (ref 6–23)
CO2: 29 meq/L (ref 19–32)
Calcium: 10.1 mg/dL (ref 8.4–10.5)
Chloride: 104 meq/L (ref 96–112)
Creatinine, Ser: 0.83 mg/dL (ref 0.40–1.50)
GFR: 89.7 mL/min (ref >60.00)
Glucose, Bld: 98 mg/dL (ref 70–99)
Potassium: 4.6 meq/L (ref 3.5–5.1)
Sodium: 140 meq/L (ref 135–145)
Total Bilirubin: 0.7 mg/dL (ref 0.2–1.2)
Total Protein: 7.1 g/dL (ref 6.0–8.3)

## 2023-08-10 LAB — LIPID PANEL
Cholesterol: 98 mg/dL (ref 0–200)
HDL: 29.4 mg/dL — ABNORMAL LOW (ref >39.00)
LDL Cholesterol: 52 mg/dL (ref 0–99)
NonHDL: 68.18
Total CHOL/HDL Ratio: 3
Triglycerides: 83 mg/dL (ref 0.0–149.0)
VLDL: 16.6 mg/dL (ref 0.0–40.0)

## 2023-08-10 LAB — CBC
HCT: 44.5 % (ref 39.0–52.0)
Hemoglobin: 14.7 g/dL (ref 13.0–17.0)
MCHC: 33 g/dL (ref 30.0–36.0)
MCV: 90 fl (ref 78.0–100.0)
Platelets: 196 K/uL (ref 150.0–400.0)
RBC: 4.95 Mil/uL (ref 4.22–5.81)
RDW: 14.3 % (ref 11.5–15.5)
WBC: 6.8 K/uL (ref 4.0–10.5)

## 2023-08-10 LAB — HEMOGLOBIN A1C: Hgb A1c MFr Bld: 6.3 % (ref 4.6–6.5)

## 2023-08-10 LAB — TSH: TSH: 1.06 u[IU]/mL (ref 0.35–5.50)

## 2023-08-10 NOTE — Assessment & Plan Note (Signed)
 Follows with pulmonology.  Was recently started on Arnuity.  Did have a flareup last month but this has resolved.  Reassuring exam today.  Continue management per pulmonology.

## 2023-08-10 NOTE — Patient Instructions (Signed)
 It was very nice to see you today!  We will check blood work today.  Please keep up the great work with your diet and exercise.  I will see you back in 6 months.  Please come back to see us  sooner if needed.  Return in about 6 months (around 02/10/2024) for Annual Physical.   Take care, Dr Kennyth  PLEASE NOTE:  If you had any lab tests, please let us  know if you have not heard back within a few days. You may see your results on mychart before we have a chance to review them but we will give you a call once they are reviewed by us .   If we ordered any referrals today, please let us  know if you have not heard from their office within the next week.   If you had any urgent prescriptions sent in today, please check with the pharmacy within an hour of our visit to make sure the prescription was transmitted appropriately.   Please try these tips to maintain a healthy lifestyle:  Eat at least 3 REAL meals and 1-2 snacks per day.  Aim for no more than 5 hours between eating.  If you eat breakfast, please do so within one hour of getting up.   Each meal should contain half fruits/vegetables, one quarter protein, and one quarter carbs (no bigger than a computer mouse)  Cut down on sweet beverages. This includes juice, soda, and sweet tea.   Drink at least 1 glass of water with each meal and aim for at least 8 glasses per day  Exercise at least 150 minutes every week.

## 2023-08-10 NOTE — Progress Notes (Signed)
   Jacob Frost is a 68 y.o. male who presents today for an office visit.  Assessment/Plan:  Chronic Problems Addressed Today: Hyperlipidemia Follows with cardiology.  He is doing a great job with diet and exercise.  Check labs today.  He is on Lipitor 80 mg daily and Zetia  10 mg daily.  Essential hypertension At goal today on lisinopril  10 mg daily.   Chronic obstructive pulmonary disease (HCC) Follows with pulmonology.  Was recently started on Arnuity.  Did have a flareup last month but this has resolved.  Reassuring exam today.  Continue management per pulmonology.  Aortic atherosclerosis (HCC) On statin.   Hyperglycemia Check A1c.   Preventative Healthcare Recently received RSV vaccine at pulmonology. Check labs today. UpToDate on screenings.     Subjective:  HPI:  See Assessment / plan for status of chronic conditions.  He is here today for 6 months follow up. He is doing well today. He did have a respiratory infection last month that was treated with steroids and antibiotics. Those symptoms have resolved.        Objective:  Physical Exam: BP 103/66   Pulse 65   Temp (!) 96.4 F (35.8 C) (Temporal)   Ht 5' 9 (1.753 m)   Wt 218 lb 6.4 oz (99.1 kg)   SpO2 96%   BMI 32.25 kg/m   Wt Readings from Last 3 Encounters:  08/10/23 218 lb 6.4 oz (99.1 kg)  07/12/23 216 lb 9.6 oz (98.2 kg)  03/18/23 225 lb (102.1 kg)    Gen: No acute distress, resting comfortably CV: Regular rate and rhythm with no murmurs appreciated Pulm: Normal work of breathing, clear to auscultation bilaterally with no crackles, wheezes, or rhonchi Neuro: Grossly normal, moves all extremities Psych: Normal affect and thought content      Jacob Frost M. Kennyth, MD 08/10/2023 8:17 AM

## 2023-08-10 NOTE — Assessment & Plan Note (Signed)
At goal today on lisinopril 10mg daily.  

## 2023-08-10 NOTE — Assessment & Plan Note (Signed)
 On statin.

## 2023-08-10 NOTE — Assessment & Plan Note (Signed)
 Follows with cardiology.  He is doing a great job with diet and exercise.  Check labs today.  He is on Lipitor 80 mg daily and Zetia  10 mg daily.

## 2023-08-10 NOTE — Assessment & Plan Note (Signed)
 Check A1c.

## 2023-08-12 ENCOUNTER — Ambulatory Visit: Payer: Self-pay | Admitting: Family Medicine

## 2023-08-12 NOTE — Progress Notes (Signed)
 His A1c is up a little bit to 6.3.  This is not in the diabetic range and he does not need to start meds for this however it is very important that he continue to work on diet and exercise.  The rest of his labs are all at goal.  We can recheck everything in a year.

## 2023-08-16 ENCOUNTER — Telehealth: Payer: Self-pay | Admitting: Family Medicine

## 2023-08-16 ENCOUNTER — Other Ambulatory Visit (HOSPITAL_BASED_OUTPATIENT_CLINIC_OR_DEPARTMENT_OTHER): Payer: Self-pay

## 2023-08-16 NOTE — Telephone Encounter (Unsigned)
 Copied from CRM [id number]. Topic: Clinical - Medication Refill >> Aug 16, 2023  9:46 AM Cynthia K wrote: Medication: aspirin  81 MG EC tablet   Has the patient contacted their pharmacy? Yes (Agent: If no, request that the patient contact the pharmacy for the refill. If patient does not wish to contact the pharmacy document the reason why and proceed with request.) (Agent: If yes, when and what did the pharmacy advise?) Pharmacy needs order to refill  This is the patient's preferred pharmacy:  [facility] [place] [facility] [address] [place] [address] Phone: [phone] Fax: [phone]   Is this the correct pharmacy for this prescription? Yes If no, delete pharmacy and type the correct one.   Has the prescription been filled recently? No  Is the patient out of the medication? No  Has the patient been seen for an appointment in the last year OR does the patient have an upcoming appointment? Yes  Can we respond through MyChart? Yes  Agent: Please be advised that Rx refills may take up to 3 business days. We ask that you follow-up with your pharmacy.

## 2023-08-17 ENCOUNTER — Other Ambulatory Visit (HOSPITAL_BASED_OUTPATIENT_CLINIC_OR_DEPARTMENT_OTHER): Payer: Self-pay

## 2023-08-17 ENCOUNTER — Other Ambulatory Visit: Payer: Self-pay | Admitting: *Deleted

## 2023-08-17 MED ORDER — ASPIRIN 81 MG PO TBEC
81.0000 mg | DELAYED_RELEASE_TABLET | Freq: Every day | ORAL | 1 refills | Status: DC
  Filled 2023-08-17: qty 90, 90d supply, fill #0
  Filled 2023-11-09: qty 90, 90d supply, fill #1

## 2023-08-17 NOTE — Telephone Encounter (Signed)
 Rx aspirin  81 MG  send to [facility] pharmacy

## 2023-08-17 NOTE — Telephone Encounter (Signed)
 Ok with me. Please place any necessary orders.

## 2023-08-17 NOTE — Telephone Encounter (Signed)
**Note De-identified  [place] Obfuscation** Please advise 

## 2023-08-18 ENCOUNTER — Other Ambulatory Visit (HOSPITAL_BASED_OUTPATIENT_CLINIC_OR_DEPARTMENT_OTHER): Payer: Self-pay

## 2023-08-18 ENCOUNTER — Other Ambulatory Visit: Payer: Self-pay

## 2023-08-19 ENCOUNTER — Other Ambulatory Visit (HOSPITAL_COMMUNITY): Payer: Self-pay

## 2023-11-09 ENCOUNTER — Other Ambulatory Visit (HOSPITAL_COMMUNITY): Payer: Self-pay

## 2023-11-10 ENCOUNTER — Encounter: Payer: Self-pay | Admitting: Pharmacist

## 2023-11-10 ENCOUNTER — Other Ambulatory Visit: Payer: Self-pay

## 2023-11-11 ENCOUNTER — Other Ambulatory Visit: Payer: Self-pay

## 2023-11-11 ENCOUNTER — Other Ambulatory Visit (HOSPITAL_BASED_OUTPATIENT_CLINIC_OR_DEPARTMENT_OTHER): Payer: Self-pay

## 2023-11-12 ENCOUNTER — Other Ambulatory Visit (HOSPITAL_BASED_OUTPATIENT_CLINIC_OR_DEPARTMENT_OTHER): Payer: Self-pay

## 2023-11-12 ENCOUNTER — Other Ambulatory Visit: Payer: Self-pay | Admitting: Family Medicine

## 2023-11-12 MED ORDER — ASPIRIN 81 MG PO TBEC
81.0000 mg | DELAYED_RELEASE_TABLET | Freq: Every day | ORAL | 1 refills | Status: DC
  Filled 2023-11-12: qty 90, 90d supply, fill #0

## 2023-11-24 ENCOUNTER — Telehealth: Payer: Self-pay | Admitting: *Deleted

## 2023-11-24 ENCOUNTER — Other Ambulatory Visit (HOSPITAL_BASED_OUTPATIENT_CLINIC_OR_DEPARTMENT_OTHER): Payer: Self-pay

## 2023-11-24 ENCOUNTER — Other Ambulatory Visit: Payer: Self-pay | Admitting: *Deleted

## 2023-11-24 MED ORDER — ASPIRIN 81 MG PO TBEC
81.0000 mg | DELAYED_RELEASE_TABLET | Freq: Every day | ORAL | 1 refills | Status: AC
  Filled 2023-11-24: qty 90, 90d supply, fill #0
  Filled 2024-02-07: qty 120, 120d supply, fill #0

## 2023-11-24 NOTE — Telephone Encounter (Signed)
 Copied from CRM #[id number]. Topic: Clinical - Medication Question >> Nov 23, 2023 10:14 AM Harlene ORN wrote: Reason for CRM: Patient called to discuss with his PCP about getting 120 day supply of low dose aspirin . Sent from the [facility]. Please call back the patient to discuss.   Rx ASA 81mg  send #[id number] to [facility]  [facility]

## 2023-12-11 ENCOUNTER — Other Ambulatory Visit: Payer: Self-pay | Admitting: Family Medicine

## 2023-12-28 ENCOUNTER — Other Ambulatory Visit (HOSPITAL_BASED_OUTPATIENT_CLINIC_OR_DEPARTMENT_OTHER): Payer: Self-pay

## 2023-12-28 DIAGNOSIS — R911 Solitary pulmonary nodule: Secondary | ICD-10-CM | POA: Diagnosis not present

## 2023-12-28 DIAGNOSIS — J432 Centrilobular emphysema: Secondary | ICD-10-CM | POA: Diagnosis not present

## 2024-02-07 ENCOUNTER — Other Ambulatory Visit (HOSPITAL_COMMUNITY): Payer: Self-pay

## 2024-02-07 ENCOUNTER — Other Ambulatory Visit: Payer: Self-pay

## 2024-02-14 ENCOUNTER — Encounter: Admitting: Family Medicine

## 2024-02-14 ENCOUNTER — Other Ambulatory Visit: Payer: Self-pay | Admitting: Family Medicine

## 2024-03-21 ENCOUNTER — Ambulatory Visit: Payer: Medicare PPO
